# Patient Record
Sex: Female | Born: 1980 | Race: Black or African American | Hispanic: No | Marital: Single | Smoking: Current some day smoker
Health system: Southern US, Community
[De-identification: ages and names within clinical notes are randomized; demographics above are authoritative.]

## PROBLEM LIST (undated history)

## (undated) DIAGNOSIS — R519 Headache, unspecified: Secondary | ICD-10-CM

## (undated) DIAGNOSIS — R51 Headache: Secondary | ICD-10-CM

## (undated) DIAGNOSIS — M549 Dorsalgia, unspecified: Secondary | ICD-10-CM

## (undated) DIAGNOSIS — I10 Essential (primary) hypertension: Secondary | ICD-10-CM

## (undated) DIAGNOSIS — E7800 Pure hypercholesterolemia, unspecified: Secondary | ICD-10-CM

## (undated) DIAGNOSIS — G8929 Other chronic pain: Secondary | ICD-10-CM

## (undated) DIAGNOSIS — E785 Hyperlipidemia, unspecified: Secondary | ICD-10-CM

## (undated) DIAGNOSIS — E669 Obesity, unspecified: Secondary | ICD-10-CM

## (undated) HISTORY — DX: Dorsalgia, unspecified: M54.9

## (undated) HISTORY — DX: Pure hypercholesterolemia, unspecified: E78.00

## (undated) HISTORY — DX: Essential (primary) hypertension: I10

---
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
## 2011-10-09 DIAGNOSIS — R079 Chest pain, unspecified: Secondary | ICD-10-CM

## 2015-06-09 ENCOUNTER — Emergency Department (HOSPITAL_COMMUNITY)
Admission: EM | Admit: 2015-06-09 | Discharge: 2015-06-09 | Disposition: A | Discharge status: Home / Self Care | Payer: Medicaid Other | Attending: Emergency Medicine | Admitting: Emergency Medicine

## 2015-06-09 ENCOUNTER — Encounter (HOSPITAL_COMMUNITY): Payer: Self-pay

## 2015-06-09 DIAGNOSIS — I10 Essential (primary) hypertension: Secondary | ICD-10-CM | POA: Insufficient documentation

## 2015-06-09 DIAGNOSIS — H538 Other visual disturbances: Secondary | ICD-10-CM

## 2015-06-09 DIAGNOSIS — R51 Headache: Secondary | ICD-10-CM | POA: Insufficient documentation

## 2015-06-09 DIAGNOSIS — R519 Headache, unspecified: Secondary | ICD-10-CM

## 2015-06-09 DIAGNOSIS — F17200 Nicotine dependence, unspecified, uncomplicated: Secondary | ICD-10-CM | POA: Insufficient documentation

## 2015-06-09 DIAGNOSIS — Z791 Long term (current) use of non-steroidal anti-inflammatories (NSAID): Secondary | ICD-10-CM | POA: Insufficient documentation

## 2015-06-09 HISTORY — DX: Essential (primary) hypertension: I10

## 2015-06-09 LAB — PREGNANCY, URINE: Preg Test, Ur: NEGATIVE

## 2015-06-09 LAB — BASIC METABOLIC PANEL
Anion gap: 7 (ref 5–15)
BUN: 11 mg/dL (ref 6–20)
CO2: 25 mmol/L (ref 22–32)
Calcium: 8.9 mg/dL (ref 8.9–10.3)
Chloride: 108 mmol/L (ref 101–111)
Creatinine, Ser: 0.82 mg/dL (ref 0.44–1.00)
GFR calc Af Amer: >60 mL/min (ref >60)
GFR calc non Af Amer: >60 mL/min (ref >60)
Glucose, Bld: 93 mg/dL (ref 65–99)
Potassium: 3.6 mmol/L (ref 3.5–5.1)
Sodium: 140 mmol/L (ref 135–145)

## 2015-06-09 MED ORDER — METOCLOPRAMIDE HCL 10 MG PO TABS
10.0000 mg | ORAL_TABLET | Freq: Once | ORAL | Status: AC
  Administered 2015-06-09: 10 mg via ORAL
  Filled 2015-06-09: qty 1

## 2015-06-09 MED ORDER — ACETAMINOPHEN 500 MG PO TABS
1000.0000 mg | ORAL_TABLET | Freq: Once | ORAL | Status: AC
  Administered 2015-06-09: 1000 mg via ORAL
  Filled 2015-06-09: qty 2

## 2015-06-09 MED ORDER — SODIUM CHLORIDE 0.9 % IV BOLUS (SEPSIS)
500.0000 mL | Freq: Once | INTRAVENOUS | Status: AC
  Administered 2015-06-09: 500 mL via INTRAVENOUS

## 2015-06-09 MED ORDER — HYDROCHLOROTHIAZIDE 25 MG PO TABS
ORAL_TABLET | ORAL | Status: AC
  Filled 2015-06-09: qty 1

## 2015-06-09 MED ORDER — HYDROCHLOROTHIAZIDE 12.5 MG PO CAPS: 12.5000 mg | ORAL_CAPSULE | Freq: Every day | ORAL | Status: DC

## 2015-06-09 MED ORDER — HYDROCHLOROTHIAZIDE 12.5 MG PO CAPS
12.5000 mg | ORAL_CAPSULE | Freq: Every day | ORAL | Status: DC
  Administered 2015-06-09: 12.5 mg via ORAL
  Filled 2015-06-09 (×2): qty 1

## 2015-06-09 MED ORDER — DIPHENHYDRAMINE HCL 25 MG PO CAPS
25.0000 mg | ORAL_CAPSULE | Freq: Once | ORAL | Status: AC
  Administered 2015-06-09: 25 mg via ORAL
  Filled 2015-06-09: qty 1

## 2015-06-09 NOTE — ED Provider Notes (Signed)
CSN: [id number]     Arrival date & time 06/09/15  1412 History   First MD Initiated Contact with Patient 06/09/15 1435     Chief Complaint  Patient presents with  . Hypertension     (Consider location/radiation/quality/duration/timing/severity/associated sxs/prior Treatment) HPI Comments: Mia Norris is a 35 y.o. Female reports to ED with complaint of hypertension, headache, and visual changes. Patient reports being diagnosed with hypertension approximately one year ago. She was currently taking HCTZ for management; however, she ran out of the medication approximately three days ago. Patient reports approximately two days the onset of a headache and changes in vision, specifically, blurry vision. Headache onset was gradual in nature, reaching maximal intensity of 8/10 in two hours. Location is frontal and behind the eyes. Pulsating in nature. Associated lightheadedness and dizziness. Denies photophobia, nausea, vomiting, fever, neck pain, or numbness/tingling/weakness. Denies any trauma to head.    Patient is a 35 y.o. female presenting with hypertension. The history is provided by the patient.  Hypertension This is a chronic problem. The current episode started in the past 7 days. Associated symptoms include headaches and a visual change. Pertinent negatives include no abdominal pain, chest pain, chills, diaphoresis, fatigue, fever, nausea, neck pain, numbness, sore throat, vomiting or weakness.    Past Medical History  Diagnosis Date  . Hypertension    Past Surgical History  Procedure Laterality Date  . Cesarean section     No family history on file. Social History  Substance Use Topics  . Smoking status: Current Every Day Smoker  . Smokeless tobacco: None  . Alcohol Use: No   OB History    No data available     Review of Systems  Constitutional: Negative for fever, chills, diaphoresis and fatigue.  HENT: Negative for sore throat.   Eyes: Positive for visual disturbance.  Negative for photophobia.  Respiratory: Negative for shortness of breath.   Cardiovascular: Negative for chest pain and leg swelling.  Gastrointestinal: Negative for nausea, vomiting, abdominal pain, diarrhea, constipation and blood in stool.  Genitourinary: Negative for dysuria and hematuria.  Musculoskeletal: Negative for neck pain.  Neurological: Positive for dizziness, light-headedness and headaches. Negative for weakness and numbness.      Allergies  Penicillins  Home Medications   Prior to Admission medications   Medication Sig Start Date End Date Taking? Authorizing Provider  ibuprofen (ADVIL,MOTRIN) 200 MG tablet Take 400 mg by mouth every 6 (six) hours as needed for moderate pain.   Yes Historical Provider, MD  hydrochlorothiazide (MICROZIDE) 12.5 MG capsule Take 1 capsule (12.5 mg total) by mouth daily. 06/09/15   Lona Kettle, PA-C   BP 168/89 mmHg  Pulse 68  Temp(Src) 98 F (36.7 C) (Oral)  Resp 16  Ht  (1.676 m)  Wt 99.791 kg  BMI 35.53 kg/m2  SpO2 100%  LMP 05/06/2015 Physical Exam  Constitutional: She appears well-developed and well-nourished. No distress.  HENT:  Head: Normocephalic and atraumatic.  Mouth/Throat: Oropharynx is clear and moist. No oropharyngeal exudate.  2+ pulses in temporal arteries. No tenderness to palpation of temporal arteries.   Eyes: Conjunctivae, EOM and lids are normal. Pupils are equal, round, and reactive to light. Right eye exhibits no discharge. Left eye exhibits no discharge. No scleral icterus.  Visual acuity 20/30 in left and right eye.   Neck: Normal range of motion. Neck supple.  Cardiovascular: Normal rate, regular rhythm, normal heart sounds and intact distal pulses.   No murmur heard. Pulmonary/Chest: Effort normal and  breath sounds normal. No respiratory distress.  Abdominal: Soft. Bowel sounds are normal. There is no tenderness. There is no rebound and no guarding.  Musculoskeletal: Normal range of motion.   Lymphadenopathy:    She has no cervical adenopathy.  Neurological: She is alert. She has normal strength. No cranial nerve deficit or sensory deficit. She displays a negative Romberg sign. Coordination normal.  Skin: Skin is warm and dry. She is not diaphoretic.  Psychiatric: She has a normal mood and affect.    ED Course  Procedures (including critical care time) Labs Review Labs Reviewed  PREGNANCY, URINE  BASIC METABOLIC PANEL    Imaging Review No results found.    EKG Interpretation None      MDM   Final diagnoses:  Essential hypertension  Nonintractable headache, unspecified chronicity pattern, unspecified headache type  Blurry vision   Patient reports to ED with elevated blood pressure, headache, and blurry vision. DDx extensive to include stroke, intracranial hemorrhage, vertebral dissection, GCA, pre-eclampsia, PRES syndrome, or infectious etiology. History negative for fever, trauma, photophobia, sudden/severe onset of headache, numbness/tingling/weakness in extremities. No fevers. No focal neurologic deficits were noted on physical exam. Neck ROM and strength intact, no TTP of neck. Patient ambulates well. 2+ pulses and no TTP of temporal arteries. Negative pregnancy test. BMP within normal limits. Patient received fluids, home dose of HCTZ, and reglan/benadryl/acetaminophin for headache relief. On re-evaluation patient endorsed improvement in headache and subjective improvement in blurry vision. Blood pressure improved from 154/94 to 126/78. Headache and blurry vision most likely secondary to elevated blood pressure. Discussed with patient importance of maintaining adequate blood pressure control and that uncontrolled blood pressure increases risk of stroke. Prescribed patient's home hypertensive medication for 90 days. Encouraged to establish PCP and provided resources of local providers. Discussed return precautions to include worsening symptoms, fever, neck pain,  numbness/tingling/weakness in extremities. Patient voiced understanding and is agreeable.   Lona KettleAshley Laurel Meyer, PA-C 06/10/15 0053  Lona KettleAshley Laurel Meyer, PA-C 06/10/15 1053  Eber HongBrian Miller, MD 06/11/15 1005

## 2015-06-09 NOTE — ED Notes (Signed)
Pt reports ran out of her bp medication 3 days ago and c/o headache.  Pt takes hydrochlorothiazide 12.5mg  daily.

## 2015-06-09 NOTE — ED Notes (Signed)
In to discharge pt. bp still elevated. Will check again

## 2015-06-09 NOTE — Discharge Instructions (Signed)
Take blood pressure medications as prescribed. Maintain a low sodium diet. Make sure to establish PCP. If you develop worsening symptoms, fever, neck pain, shortness of breath, chest pain, numbness/tingling/weakness, dizziness, or loss of consciousness return to the ED immediately.  [employer] is a great source of information about community services available.  Access by dialing 2-1-1 from anywhere in [place], or by website -  PooledIncome.pl.   Other Local Resources (Updated 02/2015)  Financial Assistance   Services    Phone Number and Address  [facility]  Low-cost medical care - 1st and 3rd Saturday of every month  Must not qualify for public or private insurance and must have limited income [phone] [address] [place], [place]    [place] [employer] of Social Services  Child care  Emergency assistance for housing and [employer]  Medicaid [phone] [address] [place], [place] [address]   [facility]  Low-cost medical care for children, communicable diseases, sexually-transmitted diseases, immunizations, maternity care, womens health and family planning [phone] [address] [place], [place] [address]  [facility]   Medication assistance for [facility] residents  Must meet income requirements [phone] [address] [place], [place].    [facility] Social [facility]  Child care  Emergency assistance for housing and [employer]  Medicaid [phone] [place], [place] [address]  [facility]   Low-cost medical care,   Monday through Friday, 9 am to 6 pm.   Accepts Medicare/Medicaid, and self-pay [phone] [address] [place], [place] [address]  [facility]  Low-cost  medical care - Monday through Friday, 8:30 am - 5:30 pm  Accepts Medicaid and self-pay [phone] [address] [place], [place] [address]   [facility]  Primary medical care, including for those with sickle cell disease  Accepts Medicare, Medicaid, insurance and self-pay [phone] [address] [place], [place]  [facility]   Primary medical care  Accepts Medicare, [place], insurance and self-pay [phone] [address] [place], [place] [address]   [facility] Department of Social Services  Child care  Emergency assistance for housing and [employer]  Medicaid [phone] [address] [place], [place] [address]  [facility] Department of Health and [employer]  Child care  Emergency assistance for housing and [employer]  Medicaid [phone] [address] [place], [place] [address]   [facility] Medication Assistance Program  Medication assistance for [facility] residents with no insurance only  Must have a primary care doctor [phone] [address] [place], [place]  [facility]   Primary medical care  [place], [place], insurance  [phone] [address] [place], [place]  [facility]   Medication assistance [phone]  [facility]   Primary medical care  Accepts Medicare, [place], insurance and self-pay [phone] [address] [place], [place] [address]  [facility]   Primary medical care  Accepts Medicare, [place], insurance and self-pay [phone] [address] [place], [place] [address]  [facility]  For [place] residents between the ages of 75 and 66 who do not have any form of health insurance, Medicare, [place], or [place] benefits.  Services are provided free of charge to uninsured patients  who fall within  federal poverty guidelines.    Hours: Tuesdays and Thursdays, 4:15 - 8 pm [phone] [address] [place], [place] [address]  [facility]     Primary medical care  Dental care  Nutritional counseling  Pharmacy  Accepts Medicaid, Medicare, most insurance.  Fees are adjusted based on ability to pay.   [phone] [facility] [address] [place], [place]  [phone] [facility] [address] [place], [place]  [phone] [facility] [place], [place]  [phone] [facility], [address] [place], [place]  [phone] [facility] [address] [place], [place]  [facility]  Womens health and family planning [phone] [address] [place], [place]  [facility] Department of Social Services  Child care  Emergency assistance for housing and [employer]  Medicaid [phone] [address] [place], [place] [address]   [facility]    Ages 42 and older  Hours: Mondays and Thursdays, 7:00 am - 9:00 am Patients are seen on a first come, first served basis. [phone], [phone] [place], [place]  [facility] Division of Social Services  Child care  Emergency assistance for housing and [employer]  Medicaid [phone] [address] [place], [place] [address]  [facility]  Medication assistance  Rental assistance  Food pantry  Medication assistance  Housing assistance  Emergency food distribution  Utility assistance [phone] [address] [place], [place]  [phone]  [address] [place], [place] [address] Hours: Tuesdays and Thursdays from 9am - 12 noon by appointment only  [phone] [address] [place], [place] [address]  [facility]    Accepts private insurance, [place], and [place].  Payment is based on a sliding scale for those without insurance.  Hours: Mondays, Tuesdays and Thursdays, 8:30 am - 5:30 pm.   [phone] [address] [place], [place]  [facility] - Family Medicine at [facility], [place], and [place].  Payment is based on a sliding scale for those without insurance. [phone] [address] [place], [place]  [facility] - Pediatrics at [address]. Scientist, research (physical sciences), [employer], and [place].  Payment is based on a sliding scale for those without insurance [phone] [address], [employer], [place]  [facility] - Pediatrics at [employer], [place], and [place].  Payment is based on a sliding scale for those without insurance. [phone] [address] [place], [place]  [facility] - Pediatrics at [facility], [place], and [place].  Payment is based on a sliding scale for those without insurance. [phone], [phone] [place], [place].    [facility]  Maternity care.  Accepts Medicaid and self-pay. [phone] [address] [place], [place]    Managing Your High Blood Pressure Blood pressure is a measurement of how forceful your blood is pressing against the walls of the arteries. Arteries are muscular tubes within the circulatory system. Blood pressure does not stay the same. Blood pressure rises when you are active, excited, or nervous; and it lowers during sleep and relaxation. If the numbers measuring your blood pressure stay above normal most of the time, you are at risk for health problems.  High blood pressure (hypertension) is a long-term (chronic) condition in which blood pressure is elevated. A blood pressure reading is  recorded as two numbers, such as 120 over 80 (or 120/80). The first, higher number is called the systolic pressure. It is a measure of the pressure in your arteries as the heart beats. The second, lower number is called the diastolic pressure. It is a measure of the pressure in your arteries as the heart relaxes between beats.  Keeping your blood pressure in a normal range is important to your overall health and prevention of health problems, such as heart disease and stroke. When your blood pressure is uncontrolled, your heart has to work harder than normal. High blood pressure is a very common condition in adults because blood pressure tends to rise with age. Men and women are equally likely to have hypertension but at different times in life. Before age 35, men are more likely to have hypertension. After 35 years of age, women are more likely to have it. Hypertension is especially common in African Americans. This condition often has no signs or symptoms. The cause of the condition is usually not known. Your caregiver can help you come up with a plan to keep your blood pressure in a normal, healthy range. BLOOD PRESSURE STAGES Blood pressure is classified into four stages: normal, prehypertension, stage 1, and stage 2. Your blood pressure reading will be used to determine what type of treatment, if any, is necessary. Appropriate treatment options are tied to these four stages:  Normal  Systolic pressure (mm Hg): below 120.  Diastolic pressure (mm Hg): below 80. Prehypertension  Systolic pressure (mm Hg): 120 to 139.  Diastolic pressure (mm Hg): 80 to 89. Stage1  Systolic pressure (mm Hg): 140 to 159.  Diastolic pressure (mm Hg): 90 to 99. Stage2  Systolic pressure (mm Hg): 160 or above.  Diastolic pressure (mm Hg): 100 or above. RISKS RELATED TO HIGH BLOOD PRESSURE Managing your blood pressure is an important responsibility. Uncontrolled high blood pressure can lead to:  A heart  attack.  A stroke.  A weakened blood vessel (aneurysm).  Heart failure.  Kidney damage.  Eye damage.  Metabolic syndrome.  Memory and concentration problems. HOW TO MANAGE YOUR BLOOD PRESSURE Blood pressure can be managed effectively with lifestyle changes and medicines (if needed). Your caregiver will help you come up with a plan to bring your blood pressure within a normal range. Your plan should include the following: Education  Read all information provided by your caregivers about how to control blood pressure.  Educate yourself on the latest guidelines and treatment recommendations. New research is always being done to further define the risks and treatments for high blood pressure. Lifestylechanges  Control your weight.  Avoid smoking.  Stay physically active.  Reduce the amount of salt in your diet.  Reduce stress.  Control any chronic conditions, such as high cholesterol or diabetes.  Reduce your alcohol intake. Medicines  Several medicines (antihypertensive medicines) are available, if needed, to bring blood pressure within a normal range. Communication  Review all the medicines you take with your caregiver because there may be side effects or interactions.  Talk with your caregiver about your diet, exercise habits, and other lifestyle factors that may be contributing to high blood pressure.  See your caregiver regularly. Your caregiver can help you create and adjust your plan for managing high blood pressure. RECOMMENDATIONS FOR TREATMENT AND FOLLOW-UP  The following recommendations are based on current guidelines  for managing high blood pressure in nonpregnant adults. Use these recommendations to identify the proper follow-up period or treatment option based on your blood pressure reading. You can discuss these options with your caregiver.  Systolic pressure of 120 to 139 or diastolic pressure of 80 to 89: Follow up with your caregiver as  directed.  Systolic pressure of 140 to 160 or diastolic pressure of 90 to 100: Follow up with your caregiver within 2 months.  Systolic pressure above 160 or diastolic pressure above 100: Follow up with your caregiver within 1 month.  Systolic pressure above 180 or diastolic pressure above 110: Consider antihypertensive therapy; follow up with your caregiver within 1 week.  Systolic pressure above 200 or diastolic pressure above 120: Begin antihypertensive therapy; follow up with your caregiver within 1 week.   This information is not intended to replace advice given to you by your health care provider. Make sure you discuss any questions you have with your health care provider.   Document Released: 10/19/2011 Document Reviewed: 10/19/2011 Elsevier Interactive Patient Education [employer]

## 2017-08-23 ENCOUNTER — Encounter (HOSPITAL_COMMUNITY): Payer: Self-pay

## 2017-08-23 ENCOUNTER — Emergency Department (HOSPITAL_COMMUNITY): Payer: Self-pay

## 2017-08-23 ENCOUNTER — Other Ambulatory Visit: Payer: Self-pay

## 2017-08-23 ENCOUNTER — Emergency Department (HOSPITAL_COMMUNITY)
Admission: EM | Admit: 2017-08-23 | Discharge: 2017-08-23 | Disposition: A | Discharge status: Home / Self Care | Payer: Self-pay | Attending: Emergency Medicine | Admitting: Emergency Medicine

## 2017-08-23 DIAGNOSIS — Z79899 Other long term (current) drug therapy: Secondary | ICD-10-CM | POA: Insufficient documentation

## 2017-08-23 DIAGNOSIS — F17210 Nicotine dependence, cigarettes, uncomplicated: Secondary | ICD-10-CM | POA: Insufficient documentation

## 2017-08-23 DIAGNOSIS — R0789 Other chest pain: Secondary | ICD-10-CM | POA: Insufficient documentation

## 2017-08-23 DIAGNOSIS — I10 Essential (primary) hypertension: Secondary | ICD-10-CM | POA: Insufficient documentation

## 2017-08-23 DIAGNOSIS — Z760 Encounter for issue of repeat prescription: Secondary | ICD-10-CM | POA: Insufficient documentation

## 2017-08-23 HISTORY — DX: Hyperlipidemia, unspecified: E78.5

## 2017-08-23 HISTORY — DX: Obesity, unspecified: E66.9

## 2017-08-23 HISTORY — DX: Headache: R51

## 2017-08-23 HISTORY — DX: Other chronic pain: G89.29

## 2017-08-23 HISTORY — DX: Headache, unspecified: R51.9

## 2017-08-23 LAB — URINALYSIS, ROUTINE W REFLEX MICROSCOPIC
BILIRUBIN URINE: NEGATIVE
GLUCOSE, UA: NEGATIVE mg/dL
HGB URINE DIPSTICK: NEGATIVE
Ketones, ur: NEGATIVE mg/dL
Leukocytes, UA: NEGATIVE
Nitrite: NEGATIVE
PH: 5 (ref 5.0–8.0)
PROTEIN: NEGATIVE mg/dL
Specific Gravity, Urine: 1.018 (ref 1.005–1.030)

## 2017-08-23 LAB — CBC
HEMATOCRIT: 41.5 % (ref 36.0–46.0)
Hemoglobin: 13.3 g/dL (ref 12.0–15.0)
MCH: 27.7 pg (ref 26.0–34.0)
MCHC: 32 g/dL (ref 30.0–36.0)
MCV: 86.5 fL (ref 78.0–100.0)
Platelets: 299 10*3/uL (ref 150–400)
RBC: 4.8 MIL/uL (ref 3.87–5.11)
RDW: 13.8 % (ref 11.5–15.5)
WBC: 9.5 10*3/uL (ref 4.0–10.5)

## 2017-08-23 LAB — BASIC METABOLIC PANEL
ANION GAP: 9 (ref 5–15)
BUN: 8 mg/dL (ref 6–20)
CALCIUM: 8.9 mg/dL (ref 8.9–10.3)
CO2: 23 mmol/L (ref 22–32)
Chloride: 106 mmol/L (ref 98–111)
Creatinine, Ser: 0.81 mg/dL (ref 0.44–1.00)
GFR calc Af Amer: >60 mL/min (ref >60)
GFR calc non Af Amer: >60 mL/min (ref >60)
GLUCOSE: 86 mg/dL (ref 70–99)
POTASSIUM: 3.5 mmol/L (ref 3.5–5.1)
SODIUM: 138 mmol/L (ref 135–145)

## 2017-08-23 LAB — PREGNANCY, URINE: PREG TEST UR: NEGATIVE

## 2017-08-23 LAB — TROPONIN I

## 2017-08-23 LAB — D-DIMER, QUANTITATIVE (NOT AT ARMC): D DIMER QUANT: 0.45 ug{FEU}/mL (ref 0.00–0.50)

## 2017-08-23 MED ORDER — HYDROCHLOROTHIAZIDE 12.5 MG PO TABS: 12.5000 mg | ORAL_TABLET | Freq: Every day | ORAL | 0 refills | Status: DC

## 2017-08-23 MED ORDER — HYDROCODONE-ACETAMINOPHEN 5-325 MG PO TABS: ORAL_TABLET | ORAL | 0 refills | Status: DC

## 2017-08-23 MED ORDER — METHOCARBAMOL 500 MG PO TABS: 1000.0000 mg | ORAL_TABLET | Freq: Four times a day (QID) | ORAL | 0 refills | Status: DC | PRN

## 2017-08-23 MED ORDER — OXYCODONE-ACETAMINOPHEN 5-325 MG PO TABS
1.0000 | ORAL_TABLET | Freq: Once | ORAL | Status: AC
  Administered 2017-08-23: 1 via ORAL
  Filled 2017-08-23: qty 1

## 2017-08-23 NOTE — ED Notes (Signed)
Pain is in mid chest, worse with palpation. Also has scapular pain towards spine.

## 2017-08-23 NOTE — ED Provider Notes (Signed)
[facility] EMERGENCY DEPARTMENT Provider Note   CSN: [id number] Arrival date & time: 08/23/17  1256     History   Chief Complaint Chief Complaint  Patient presents with  . Chest Pain    HPI Mia Norris is a 37 y.o. female.  HPI Pt was seen at 1325. Per pt, c/o gradual onset and persistence of constant left upper chest wall "pain" since 08/18/2017. Pt describes the pain as constant, "aching," worsening with palpation of the area and moving her left shoulder/arm. Pt states she was evaluated at [facility] for this complaint on 08/21/2017, dx chest wall pain, rx naprosyn which she did not fill. Pt states she was told to f/u with her PMD regarding her chronic HTN and refill of her BP meds. Pt was evaluated by the Health Department today, informed them of her chest pain, and was sent to the ED for further evaluation. Pt denies any change in her constant symptoms over the past 5 days. Denies new symptoms. Denies palpitations, no SOB/cough, no abd pain, no N/V/D, no injury, no fevers, no focal motor weakness, no tingling/numbness in extremities.    Past Medical History:  Diagnosis Date  . Chronic headaches   . Hyperlipidemia   . Hypertension   . Obesity     There are no active problems to display for this patient.   Past Surgical History:  Procedure Laterality Date  . CESAREAN SECTION       OB History   None      Home Medications    Prior to Admission medications   Medication Sig Start Date End Date Taking? Authorizing Provider  Aspirin-Acetaminophen-Caffeine (EXCEDRIN MIGRAINE PO) Take 2 tablets by mouth daily as needed (headaches).   Yes [provider]  diltiazem (TIAZAC) 180 MG 24 hr capsule Take 180 mg by mouth daily. 06/12/17  Yes [provider]  ibuprofen (ADVIL,MOTRIN) 200 MG tablet Take 400 mg by mouth every 6 (six) hours as needed for moderate pain.   Yes [provider]  Multiple Vitamins-Iron (DAILY MULTIPLE VITAMIN/IRON) TABS  Take 1 tablet by mouth daily.   Yes [provider]  Multiple Vitamins-Minerals (ONE-A-DAY WOMENS VITACRAVES) CHEW Chew 1 each by mouth daily.   Yes [provider]  hydrochlorothiazide (MICROZIDE) 12.5 MG capsule Take 1 capsule (12.5 mg total) by mouth daily. Patient not taking: Reported on 08/23/2017 06/09/15   Deborha Payment, PA-C    Family History No family history on file.  Social History Social History   Tobacco Use  . Smoking status: Current Every Day Smoker    Packs/day: 0.50    Types: Cigarettes  Substance Use Topics  . Alcohol use: No  . Drug use: No     Allergies   Penicillins   Review of Systems Review of Systems ROS: Statement: All systems negative except as marked or noted in the HPI; Constitutional: Negative for fever and chills. ; ; Eyes: Negative for eye pain, redness and discharge. ; ; ENMT: Negative for ear pain, hoarseness, nasal congestion, sinus pressure and sore throat. ; ; Cardiovascular: Negative for palpitations, diaphoresis, dyspnea and peripheral edema. ; ; Respiratory: Negative for cough, wheezing and stridor. ; ; Gastrointestinal: Negative for nausea, vomiting, diarrhea, abdominal pain, blood in stool, hematemesis, jaundice and rectal bleeding. . ; ; Genitourinary: Negative for dysuria, flank pain and hematuria. ; ; Musculoskeletal: +chest wall pain. Negative for back pain and neck pain. Negative for swelling and trauma.; ; Skin: Negative for pruritus, rash, abrasions, blisters, bruising  and skin lesion.; ; Neuro: Negative for headache, lightheadedness and neck stiffness. Negative for weakness, altered level of consciousness, altered mental status, extremity weakness, paresthesias, involuntary movement, seizure and syncope.       Physical Exam Updated Vital Signs BP (!) 160/87   Pulse (!) 49   Temp 98.1 F (36.7 C) (Oral)   Resp 14   Ht 5\' 6"  (1.676 m)   Wt 116.1 kg (256 lb)   LMP 08/14/2017   SpO2 100%   BMI 41.32 kg/m     Patient Vitals for the past 24 hrs:  BP Temp Temp src Pulse Resp SpO2 Height Weight  08/23/17 1500 (!) 150/99 - - (!) 48 14 100 % - -  08/23/17 1430 (!) 160/87 - - (!) 49 14 100 % - -  08/23/17 1400 (!) 160/82 - - 63 14 100 % - -  08/23/17 1330 (!) 151/111 - - (!) 56 13 100 % - -  08/23/17 1313 - 98.1 F (36.7 C) Oral - - - - -  08/23/17 1313 (!) 160/104 - - (!) 54 20 99 % - -  08/23/17 1310 - - - - - - 5\' 6"  (1.676 m) 116.1 kg (256 lb)  08/23/17 1309 (!) 192/112 - - 67 20 98 % - -     Physical Exam 1330: Physical examination:  Nursing notes reviewed; Vital signs and O2 SAT reviewed;  Constitutional: Well developed, Well nourished, Well hydrated, In no acute distress; Head:  Normocephalic, atraumatic; Eyes: EOMI, PERRL, No scleral icterus; ENMT: Mouth and pharynx normal, Mucous membranes moist; Neck: Supple, Full range of motion, No lymphadenopathy; Cardiovascular: Regular rate and rhythm, No gallop; Respiratory: Breath sounds clear & equal bilaterally, No wheezes.  Speaking full sentences with ease, Normal respiratory effort/excursion; Chest: +left upper chest wall tenderness to palp. No soft tissue crepitus, no deformity. Movement normal; Abdomen: Soft, Nontender, Nondistended, Normal bowel sounds; Genitourinary: No CVA tenderness; Spine:  No midline CS, TS, LS tenderness. +mild TTP left trapezius muscle. No rash.; Extremities: Peripheral pulses normal, No tenderness, No edema, No calf edema or asymmetry.; Neuro: AA&Ox3, Major CN grossly intact.  Speech clear. No gross focal motor or sensory deficits in extremities.; Skin: Color normal, Warm, Dry.    ED Treatments / Results  Labs (all labs ordered are listed, but only abnormal results are displayed)   EKG EKG Interpretation  Date/Time:  Wednesday August 23 2017 13:11:40 EDT Ventricular Rate:  60 PR Interval:    QRS Duration: 100 QT Interval:  429 QTC Calculation: 429 R Axis:   -31 Text Interpretation:  Sinus rhythm Left axis  deviation Borderline T abnormalities, inferior leads No old tracing to compare Confirmed by Samuel Jester [phone]) on 08/23/2017 2:05:22 PM   Radiology   Procedures Procedures (including critical care time)  Medications Ordered in ED Medications  oxyCODONE-acetaminophen (PERCOCET/ROXICET) 5-325 MG per tablet 1 tablet (1 tablet Oral Given 08/23/17 1354)     Initial Impression / Assessment and Plan / ED Course  I have reviewed the triage vital signs and the nursing notes.  Pertinent labs & imaging results that were available during my care of the patient were reviewed by me and considered in my medical decision making (see chart for details).  MDM Reviewed: previous chart, nursing note and vitals Interpretation: labs, ECG and x-ray   Results for orders placed or performed during the hospital encounter of 08/23/17  Basic metabolic panel  Result Value Ref Range   Sodium 138 135 - 145 mmol/L  Potassium 3.5 3.5 - 5.1 mmol/L   Chloride 106 98 - 111 mmol/L   CO2 23 22 - 32 mmol/L   Glucose, Bld 86 70 - 99 mg/dL   BUN 8 6 - 20 mg/dL   Creatinine, Ser 2.950.81 0.44 - 1.00 mg/dL   Calcium 8.9 8.9 - 28.410.3 mg/dL   GFR calc non Af Amer >60 >60 mL/min   GFR calc Af Amer >60 >60 mL/min   Anion gap 9 5 - 15  CBC  Result Value Ref Range   WBC 9.5 4.0 - 10.5 K/uL   RBC 4.80 3.87 - 5.11 MIL/uL   Hemoglobin 13.3 12.0 - 15.0 g/dL   HCT 13.241.5 44.036.0 - 10.246.0 %   MCV 86.5 78.0 - 100.0 fL   MCH 27.7 26.0 - 34.0 pg   MCHC 32.0 30.0 - 36.0 g/dL   RDW 72.513.8 36.611.5 - 44.015.5 %   Platelets 299 150 - 400 K/uL  Troponin I  Result Value Ref Range   Troponin I <0.03 <0.03 ng/mL  D-dimer, quantitative  Result Value Ref Range   D-Dimer, Quant 0.45 0.00 - 0.50 ug/mL-FEU  Pregnancy, urine  Result Value Ref Range   Preg Test, Ur NEGATIVE NEGATIVE  Urinalysis, Routine w reflex microscopic  Result Value Ref Range   Color, Urine YELLOW YELLOW   APPearance CLEAR CLEAR   Specific Gravity, Urine 1.018 1.005 -  1.030   pH 5.0 5.0 - 8.0   Glucose, UA NEGATIVE NEGATIVE mg/dL   Hgb urine dipstick NEGATIVE NEGATIVE   Bilirubin Urine NEGATIVE NEGATIVE   Ketones, ur NEGATIVE NEGATIVE mg/dL   Protein, ur NEGATIVE NEGATIVE mg/dL   Nitrite NEGATIVE NEGATIVE   Leukocytes, UA NEGATIVE NEGATIVE   Dg Chest 2 View Result Date: 08/23/2017 CLINICAL DATA:  Left sided chest pain with breathing that radiates into back of left arm x 4 days. Hx of HTN. EXAM: CHEST - 2 VIEW COMPARISON:  none FINDINGS: Lungs are clear. Heart size and mediastinal contours are within normal limits. No effusion.  No pneumothorax. Visualized bones unremarkable. IMPRESSION: No acute cardiopulmonary disease. Electronically Signed   By: Corlis Leak  Hassell M.D.   On: 08/23/2017 14:06    1510:  Pt previously taking HCTZ; will refill. Pt feels better after pain meds and wants to go home now. Doubt PE as cause for symptoms with normal d-dimer and low risk Wells.  Doubt ACS as cause for symptoms with normal troponin and EKG without acute STTW changes after 5 days of constant symptoms.Tx symptomatically at this time.  Dx and testing d/w pt and family.  Questions answered.  Verb understanding, agreeable to d/c home with outpt f/u.     Final Clinical Impressions(s) / ED Diagnoses   Final diagnoses:  None    ED Discharge Orders    None       Samuel JesterMcManus, Diondra Pines, DO 08/27/17 1722

## 2017-08-23 NOTE — Discharge Instructions (Addendum)
Take the prescriptions as directed.  Apply moist heat or ice to the area(s) of discomfort, for 15 minutes at a time, several times per day for the next few days.  Do not fall asleep on a heating or ice pack. Take your blood pressure only once per day, once or twice per week, either in the morning approximately 1 hour after you take your medicine(s) or in the evening before you go to bed.  Always sit quietly for at least 15 minutes before taking your blood pressure.  Keep a diary of your blood pressures to show your doctor at your follow up office visit.  Call your regular medical doctor today to schedule a follow up appointment within the next 3 days.  Return to the Emergency Department immediately sooner if worsening.

## 2017-08-23 NOTE — ED Triage Notes (Signed)
Pt reports CP when breathing and lifting left arm up since 08/19/17. [facility] and given prescription for Naprosyn which she states she did not get filled because she didn't have money. Pt sent from health dept due to chest pain. BP elevated and dizzy and has not had BP med for 3-4 weeks

## 2019-01-23 ENCOUNTER — Emergency Department (HOSPITAL_COMMUNITY)
Admission: EM | Admit: 2019-01-23 | Discharge: 2019-01-23 | Disposition: A | Discharge status: Home / Self Care | Payer: Medicaid Other | Attending: Emergency Medicine | Admitting: Emergency Medicine

## 2019-01-23 ENCOUNTER — Encounter (HOSPITAL_COMMUNITY): Payer: Self-pay | Admitting: Emergency Medicine

## 2019-01-23 ENCOUNTER — Other Ambulatory Visit: Payer: Self-pay

## 2019-01-23 DIAGNOSIS — Z79899 Other long term (current) drug therapy: Secondary | ICD-10-CM | POA: Insufficient documentation

## 2019-01-23 DIAGNOSIS — N939 Abnormal uterine and vaginal bleeding, unspecified: Secondary | ICD-10-CM | POA: Insufficient documentation

## 2019-01-23 DIAGNOSIS — I10 Essential (primary) hypertension: Secondary | ICD-10-CM | POA: Insufficient documentation

## 2019-01-23 DIAGNOSIS — F17210 Nicotine dependence, cigarettes, uncomplicated: Secondary | ICD-10-CM | POA: Insufficient documentation

## 2019-01-23 LAB — CBC WITH DIFFERENTIAL/PLATELET
Abs Immature Granulocytes: 0.02 10*3/uL (ref 0.00–0.07)
Basophils Absolute: 0.1 10*3/uL (ref 0.0–0.1)
Basophils Relative: 1 %
Eosinophils Absolute: 0.2 10*3/uL (ref 0.0–0.5)
Eosinophils Relative: 3 %
HCT: 29.7 % — ABNORMAL LOW (ref 36.0–46.0)
Hemoglobin: 9.3 g/dL — ABNORMAL LOW (ref 12.0–15.0)
Immature Granulocytes: 0 %
Lymphocytes Relative: 28 %
Lymphs Abs: 2.2 10*3/uL (ref 0.7–4.0)
MCH: 27.6 pg (ref 26.0–34.0)
MCHC: 31.3 g/dL (ref 30.0–36.0)
MCV: 88.1 fL (ref 80.0–100.0)
Monocytes Absolute: 0.6 10*3/uL (ref 0.1–1.0)
Monocytes Relative: 7 %
Neutro Abs: 4.8 10*3/uL (ref 1.7–7.7)
Neutrophils Relative %: 61 %
Platelets: 268 10*3/uL (ref 150–400)
RBC: 3.37 MIL/uL — ABNORMAL LOW (ref 3.87–5.11)
RDW: 16.8 % — ABNORMAL HIGH (ref 11.5–15.5)
WBC: 7.9 10*3/uL (ref 4.0–10.5)
nRBC: 0 % (ref 0.0–0.2)

## 2019-01-23 LAB — PROTIME-INR
INR: 1 (ref 0.8–1.2)
Prothrombin Time: 13.5 seconds (ref 11.4–15.2)

## 2019-01-23 LAB — COMPREHENSIVE METABOLIC PANEL
ALT: 33 U/L (ref 0–44)
AST: 28 U/L (ref 15–41)
Albumin: 3.6 g/dL (ref 3.5–5.0)
Alkaline Phosphatase: 43 U/L (ref 38–126)
Anion gap: 9 (ref 5–15)
BUN: 14 mg/dL (ref 6–20)
CO2: 27 mmol/L (ref 22–32)
Calcium: 8.9 mg/dL (ref 8.9–10.3)
Chloride: 102 mmol/L (ref 98–111)
Creatinine, Ser: 0.7 mg/dL (ref 0.44–1.00)
GFR calc Af Amer: >60 mL/min (ref >60)
GFR calc non Af Amer: >60 mL/min (ref >60)
Glucose, Bld: 113 mg/dL — ABNORMAL HIGH (ref 70–99)
Potassium: 3.4 mmol/L — ABNORMAL LOW (ref 3.5–5.1)
Sodium: 138 mmol/L (ref 135–145)
Total Bilirubin: 0.7 mg/dL (ref 0.3–1.2)
Total Protein: 6.5 g/dL (ref 6.5–8.1)

## 2019-01-23 LAB — TYPE AND SCREEN
ABO/RH(D): B POS
Antibody Screen: NEGATIVE

## 2019-01-23 LAB — HCG, QUANTITATIVE, PREGNANCY: hCG, Beta Chain, Quant, S: <1 m[IU]/mL (ref <5)

## 2019-01-23 MED ORDER — FERROUS SULFATE 325 (65 FE) MG PO TABS: 325.0000 mg | ORAL_TABLET | Freq: Every day | ORAL | 0 refills | Status: DC

## 2019-01-23 MED ORDER — MEGESTROL ACETATE 40 MG PO TABS: 40.0000 mg | ORAL_TABLET | Freq: Two times a day (BID) | ORAL | 0 refills | Status: AC

## 2019-01-23 NOTE — ED Provider Notes (Addendum)
[facility] Provider Note   CSN: [id number] Arrival date & time: 01/23/19  0710     History Chief Complaint  Patient presents with   Vaginal Bleeding    Mia AlandKristie Norris is a 38 y.o. female.  HPI    This patient is a pleasant 38 year old female with a history of hyperlipidemia and hypertension, she also has a history of recently having some increased vaginal bleeding which has been going on for about 2 weeks.  She is concerned because the appearance of the blood seems to be in clots, and is associated with some abdominal cramping and some generalized fatigue.  She denies prior blood transfusions, denies prior history of coagulopathy.  She was seen at an outside emergency department 48 hours ago, states that she had some blood work done and was told to follow-up with her primary doctor.  She knows that she has a history of anemia.  She endorses having sexual activity, she does not use any protection, she does not think she is pregnant.  She has not taken a home pregnancy test and is unaware if there was tested for pregnancy at the outside hospital.  She has not had any medications for the bleeding and has not seen a gynecologist, she does not have an established provider, she goes to the health department for her care.  Past Medical History:  Diagnosis Date   Chronic headaches    Hyperlipidemia    Hypertension    Obesity     There are no problems to display for this patient.   Past Surgical History:  Procedure Laterality Date   CESAREAN SECTION       OB History   No obstetric history on file.     History reviewed. No pertinent family history.  Social History   Tobacco Use   Smoking status: Current Every Day Smoker    Packs/day: 0.50    Types: Cigarettes   Smokeless tobacco: Never Used  Substance Use Topics   Alcohol use: No   Drug use: No    Home Medications Prior to Admission medications   Medication Sig Start Date End Date  Taking? Authorizing Provider  Aspirin-Acetaminophen-Caffeine (EXCEDRIN MIGRAINE PO) Take 2 tablets by mouth daily as needed (headaches).    [provider]  diltiazem (TIAZAC) 180 MG 24 hr capsule Take 180 mg by mouth daily. 06/12/17   [provider]  ferrous sulfate 325 (65 FE) MG tablet Take 1 tablet (325 mg total) by mouth daily. 01/23/19   Eber HongMiller, Ekin Pilar, MD  hydrochlorothiazide (HYDRODIURIL) 12.5 MG tablet Take 1 tablet (12.5 mg total) by mouth daily. 08/23/17   Samuel JesterMcManus, Kathleen, DO  hydrochlorothiazide (MICROZIDE) 12.5 MG capsule Take 1 capsule (12.5 mg total) by mouth daily. Patient not taking: Reported on 08/23/2017 06/09/15   Deborha PaymentMeyer, Ashley L, PA-C  HYDROcodone-acetaminophen (NORCO/VICODIN) 5-325 MG tablet 1 or 2 tabs PO q8 hours prn pain 08/23/17   Samuel JesterMcManus, Kathleen, DO  ibuprofen (ADVIL,MOTRIN) 200 MG tablet Take 400 mg by mouth every 6 (six) hours as needed for moderate pain.    [provider]  megestrol (MEGACE) 40 MG tablet Take 1 tablet (40 mg total) by mouth 2 (two) times daily for 5 days. Take twice daily until bleeding stops - consult with your Gynecologist before starting this medicine 01/23/19 01/28/19  Eber HongMiller, Laban Orourke, MD  methocarbamol (ROBAXIN) 500 MG tablet Take 2 tablets (1,000 mg total) by mouth 4 (four) times daily as needed for muscle spasms (muscle spasm/pain). 08/23/17  Francine Graven, DO  Multiple Vitamins-Iron (DAILY MULTIPLE VITAMIN/IRON) TABS Take 1 tablet by mouth daily.    [provider]  Multiple Vitamins-Minerals (ONE-A-DAY WOMENS VITACRAVES) CHEW Chew 1 each by mouth daily.    [provider]    Allergies    Penicillins  Review of Systems   Review of Systems  All other systems reviewed and are negative.   Physical Exam Updated Vital Signs BP 130/85 (BP Location: Left Arm)    Pulse 69    Temp 98.1 F (36.7 C) (Oral)    Resp 18    Ht 1.676 m (5\' 6" )    Wt 115.2 kg    LMP 01/09/2019    SpO2 96%    BMI 41.00 kg/m     Physical Exam Vitals and nursing note reviewed.  Constitutional:      General: She is not in acute distress.    Appearance: She is well-developed.  HENT:     Head: Normocephalic and atraumatic.     Mouth/Throat:     Pharynx: No oropharyngeal exudate.  Eyes:     General: No scleral icterus.       Right eye: No discharge.        Left eye: No discharge.     Conjunctiva/sclera: Conjunctivae normal.     Pupils: Pupils are equal, round, and reactive to light.  Neck:     Thyroid: No thyromegaly.     Vascular: No JVD.  Cardiovascular:     Rate and Rhythm: Normal rate and regular rhythm.     Heart sounds: Normal heart sounds. No murmur. No friction rub. No gallop.   Pulmonary:     Effort: Pulmonary effort is normal. No respiratory distress.     Breath sounds: Normal breath sounds. No wheezing or rales.  Abdominal:     General: Bowel sounds are normal. There is no distension.     Palpations: Abdomen is soft. There is no mass.     Tenderness: There is abdominal tenderness.     Comments: Mild reproducible lower abdominal tenderness to palpation, no guarding or peritoneal signs, no specific pain McBurney's point and no Murphy sign  Musculoskeletal:        General: No tenderness. Normal range of motion.     Cervical back: Normal range of motion and neck supple.  Lymphadenopathy:     Cervical: No cervical adenopathy.  Skin:    General: Skin is warm and dry.     Findings: No erythema or rash.  Neurological:     General: No focal deficit present.     Mental Status: She is alert. Mental status is at baseline.     Coordination: Coordination normal.     Comments: Able to walk and talk at her baseline, normal coordination and speech, no facial droop  Psychiatric:        Behavior: Behavior normal.     ED Results / Procedures / Treatments   Labs (all labs ordered are listed, but only abnormal results are displayed) Labs Reviewed  CBC WITH DIFFERENTIAL/PLATELET - Abnormal; Notable for  the following components:      Result Value   RBC 3.37 (*)    Hemoglobin 9.3 (*)    HCT 29.7 (*)    RDW 16.8 (*)    All other components within normal limits  COMPREHENSIVE METABOLIC PANEL - Abnormal; Notable for the following components:   Potassium 3.4 (*)    Glucose, Bld 113 (*)    All other components within  normal limits  PROTIME-INR  HCG, QUANTITATIVE, PREGNANCY  TYPE AND SCREEN    EKG None  Radiology No results found.  Procedures Procedures (including critical care time)  Medications Ordered in ED Medications - No data to display  ED Course  I have reviewed the triage vital signs and the nursing notes.  Pertinent labs & imaging results that were available during my care of the patient were reviewed by me and considered in my medical decision making (see chart for details).    MDM Rules/Calculators/A&P                      The patient's exam is rather unremarkable, her vital signs are normal without hypotension tachycardia fever or any other abnormal vital signs.  I suspect that if she is not pregnant this is likely related to an anovulatory cycle, will rule out anemia, she may need some Megace and follow-up with gynecology, the patient is agreeable, the health department has referred her to a gynecologic clinic, the appointment is forthcoming  CBC shows anemia to 9.3 hemoglobin, not pregnant, normal metabolic panel.  The patient will need to have outpatient treatment with Megace, she is otherwise stable appearing with no tachycardia or hypotension.  She is agreeable to the plan  Final Clinical Impression(s) / ED Diagnoses Final diagnoses:  Abnormal uterine bleeding (AUB)    Rx / DC Orders ED Discharge Orders         Ordered    megestrol (MEGACE) 40 MG tablet  2 times daily     01/23/19 0938    ferrous sulfate 325 (65 FE) MG tablet  Daily     01/23/19 0939           Eber Hong, MD 01/23/19 6045    Eber Hong, MD 01/23/19 [phone]

## 2019-01-23 NOTE — Discharge Instructions (Signed)
Please start taking daily iron supplements, you can get one from your local pharmacy.  I would also recommend that you start taking Megace, take 1 tablet twice a day for the next 5 days which should help with your bleeding as well.  If you do not have a gynecologist please see the phone number above for Dr. Glo Herring, he has our local gynecologist and can follow-up with you in follow-up.  Seek medical exam for severe or worsening difficulty breathing, weakness, fatigue or severe or worsening bleeding.

## 2019-01-23 NOTE — ED Triage Notes (Signed)
PT states she has had heavy vaginal bleeding x2 weeks with clots. PT states she was seen at [facility] on 01/21/2019 in the ED and had blood work done. PT states she feels weak and has lower abdominal pressure.

## 2019-05-01 ENCOUNTER — Ambulatory Visit (HOSPITAL_COMMUNITY)
Admission: RE | Admit: 2019-05-01 | Discharge: 2019-05-01 | Disposition: A | Discharge status: Home / Self Care | Payer: Self-pay | Source: Ambulatory Visit | Attending: *Deleted | Admitting: *Deleted

## 2019-05-01 ENCOUNTER — Other Ambulatory Visit (HOSPITAL_COMMUNITY): Payer: Self-pay | Admitting: *Deleted

## 2019-05-01 ENCOUNTER — Other Ambulatory Visit: Payer: Self-pay

## 2019-05-01 DIAGNOSIS — R0781 Pleurodynia: Secondary | ICD-10-CM

## 2019-05-07 ENCOUNTER — Other Ambulatory Visit: Payer: Self-pay | Admitting: *Deleted

## 2019-05-07 ENCOUNTER — Other Ambulatory Visit (HOSPITAL_COMMUNITY): Payer: Self-pay | Admitting: *Deleted

## 2019-05-07 DIAGNOSIS — R0781 Pleurodynia: Secondary | ICD-10-CM

## 2019-05-13 ENCOUNTER — Ambulatory Visit (HOSPITAL_COMMUNITY): Payer: Self-pay

## 2019-05-29 ENCOUNTER — Ambulatory Visit (HOSPITAL_COMMUNITY)
Admission: RE | Admit: 2019-05-29 | Discharge: 2019-05-29 | Disposition: A | Discharge status: Home / Self Care | Payer: Self-pay | Source: Ambulatory Visit | Attending: *Deleted | Admitting: *Deleted

## 2019-05-29 ENCOUNTER — Other Ambulatory Visit: Payer: Self-pay

## 2019-05-29 DIAGNOSIS — R0781 Pleurodynia: Secondary | ICD-10-CM | POA: Insufficient documentation

## 2019-09-08 ENCOUNTER — Emergency Department (HOSPITAL_COMMUNITY)
Admission: EM | Admit: 2019-09-08 | Discharge: 2019-09-09 | Disposition: A | Discharge status: Home / Self Care | Payer: Self-pay | Attending: Emergency Medicine | Admitting: Emergency Medicine

## 2019-09-08 ENCOUNTER — Other Ambulatory Visit: Payer: Self-pay

## 2019-09-08 ENCOUNTER — Encounter (HOSPITAL_COMMUNITY): Payer: Self-pay | Admitting: Emergency Medicine

## 2019-09-08 ENCOUNTER — Emergency Department (HOSPITAL_COMMUNITY): Payer: Self-pay

## 2019-09-08 DIAGNOSIS — R5383 Other fatigue: Secondary | ICD-10-CM | POA: Insufficient documentation

## 2019-09-08 DIAGNOSIS — R079 Chest pain, unspecified: Secondary | ICD-10-CM | POA: Insufficient documentation

## 2019-09-08 DIAGNOSIS — F17210 Nicotine dependence, cigarettes, uncomplicated: Secondary | ICD-10-CM | POA: Insufficient documentation

## 2019-09-08 DIAGNOSIS — Z79899 Other long term (current) drug therapy: Secondary | ICD-10-CM | POA: Insufficient documentation

## 2019-09-08 DIAGNOSIS — E876 Hypokalemia: Secondary | ICD-10-CM | POA: Insufficient documentation

## 2019-09-08 DIAGNOSIS — I10 Essential (primary) hypertension: Secondary | ICD-10-CM | POA: Insufficient documentation

## 2019-09-08 LAB — CBC
HCT: 41.7 % (ref 36.0–46.0)
Hemoglobin: 13.3 g/dL (ref 12.0–15.0)
MCH: 28.5 pg (ref 26.0–34.0)
MCHC: 31.9 g/dL (ref 30.0–36.0)
MCV: 89.5 fL (ref 80.0–100.0)
Platelets: 257 10*3/uL (ref 150–400)
RBC: 4.66 MIL/uL (ref 3.87–5.11)
RDW: 14.1 % (ref 11.5–15.5)
WBC: 13.8 10*3/uL — ABNORMAL HIGH (ref 4.0–10.5)
nRBC: 0 % (ref 0.0–0.2)

## 2019-09-08 MED ORDER — NITROGLYCERIN 0.4 MG SL SUBL: 0.4000 mg | SUBLINGUAL_TABLET | SUBLINGUAL | Status: DC | PRN

## 2019-09-08 MED ORDER — ASPIRIN 81 MG PO CHEW
324.0000 mg | CHEWABLE_TABLET | Freq: Once | ORAL | Status: AC
  Administered 2019-09-08: 324 mg via ORAL
  Filled 2019-09-08: qty 4

## 2019-09-08 NOTE — ED Triage Notes (Signed)
Pt c/o non radiating chest tightness that began yesterday. Denies N/V/D, cough, covid exposure.

## 2019-09-08 NOTE — ED Notes (Signed)
Call to lab for draw

## 2019-09-08 NOTE — ED Provider Notes (Signed)
[facility] EMERGENCY DEPARTMENT Provider Note   CSN: [id number] Arrival date & time: 09/08/19  2118   History Chief Complaint  Patient presents with  . Chest Pain    Mia Norris is a 39 y.o. female.  The history is provided by the patient.  Chest Pain She has history of hypertension, hyperlipidemia and comes in because of fatigue and chest pain since yesterday.  She describes a tight feeling in her chest which has been constant.  Nothing makes it better, nothing makes it worse.  She rates it at 5/10.  She denies dyspnea, nausea, diaphoresis.  She denies fever or chills.  She denies cough.  She does smoke an occasional cigarette.  There is no family history of premature coronary atherosclerosis.  Past Medical History:  Diagnosis Date  . Chronic headaches   . Hyperlipidemia   . Hypertension   . Obesity     There are no problems to display for this patient.   Past Surgical History:  Procedure Laterality Date  . CESAREAN SECTION       OB History   No obstetric history on file.     No family history on file.  Social History   Tobacco Use  . Smoking status: Current Every Day Smoker    Packs/day: 0.00    Types: Cigarettes  . Smokeless tobacco: Never Used  Vaping Use  . Vaping Use: Never used  Substance Use Topics  . Alcohol use: Yes    Alcohol/week: 1.0 standard drink    Types: 1 Glasses of wine per week  . Drug use: Yes    Frequency: 7.0 times per week    Types: Marijuana    Home Medications Prior to Admission medications   Medication Sig Start Date End Date Taking? Authorizing Provider  Aspirin-Acetaminophen-Caffeine (EXCEDRIN MIGRAINE PO) Take 2 tablets by mouth daily as needed (headaches).    [provider]  diltiazem (TIAZAC) 180 MG 24 hr capsule Take 180 mg by mouth daily. 06/12/17   [provider]  ferrous sulfate 325 (65 FE) MG tablet Take 1 tablet (325 mg total) by mouth daily. 01/23/19   Eber Hong, MD  hydrochlorothiazide  (HYDRODIURIL) 12.5 MG tablet Take 1 tablet (12.5 mg total) by mouth daily. 08/23/17   Samuel Jester, DO  hydrochlorothiazide (MICROZIDE) 12.5 MG capsule Take 1 capsule (12.5 mg total) by mouth daily. Patient not taking: Reported on 08/23/2017 06/09/15   Deborha Payment, PA-C  HYDROcodone-acetaminophen (NORCO/VICODIN) 5-325 MG tablet 1 or 2 tabs PO q8 hours prn pain 08/23/17   Samuel Jester, DO  ibuprofen (ADVIL,MOTRIN) 200 MG tablet Take 400 mg by mouth every 6 (six) hours as needed for moderate pain.    [provider]  methocarbamol (ROBAXIN) 500 MG tablet Take 2 tablets (1,000 mg total) by mouth 4 (four) times daily as needed for muscle spasms (muscle spasm/pain). 08/23/17   Samuel Jester, DO  Multiple Vitamins-Iron (DAILY MULTIPLE VITAMIN/IRON) TABS Take 1 tablet by mouth daily.    [provider]  Multiple Vitamins-Minerals (ONE-A-DAY WOMENS VITACRAVES) CHEW Chew 1 each by mouth daily.    [provider]    Allergies    Penicillins  Review of Systems   Review of Systems  Cardiovascular: Positive for chest pain.  All other systems reviewed and are negative.   Physical Exam Updated Vital Signs BP (!) 142/99 (BP Location: Right Arm)   Pulse 68   Temp 98 F (36.7 C) (Oral)   Resp 17   Ht 5'  6" (1.676 m)   Wt (!) 120.2 kg   LMP 08/12/2019 (Approximate)   SpO2 99%   BMI 42.77 kg/m   Physical Exam Vitals and nursing note reviewed.   39 year old female, resting comfortably and in no acute distress. Vital signs are significant for elevated blood pressure. Oxygen saturation is 99%, which is normal. Head is normocephalic and atraumatic. PERRLA, EOMI. Oropharynx is clear. Neck is nontender and supple without adenopathy or JVD. Back is nontender and there is no CVA tenderness. Lungs are clear without rales, wheezes, or rhonchi. Chest is nontender. Heart has regular rate and rhythm without murmur. Abdomen is soft, flat, nontender without masses or  hepatosplenomegaly and peristalsis is normoactive. Extremities have no cyanosis or edema, full range of motion is present. Skin is warm and dry without rash. Neurologic: Mental status is normal, cranial nerves are intact, there are no motor or sensory deficits.  ED Results / Procedures / Treatments   Labs (all labs ordered are listed, but only abnormal results are displayed) Labs Reviewed  BASIC METABOLIC PANEL - Abnormal; Notable for the following components:      Result Value   Sodium 134 (*)    Potassium 3.3 (*)    All other components within normal limits  CBC - Abnormal; Notable for the following components:   WBC 13.8 (*)    All other components within normal limits  HEPATIC FUNCTION PANEL  POC URINE PREG, ED  TROPONIN I (HIGH SENSITIVITY)  TROPONIN I (HIGH SENSITIVITY)    EKG EKG Interpretation  Date/Time:  Sunday September 08 2019 21:50:43 EDT Ventricular Rate:  75 PR Interval:  158 QRS Duration: 82 QT Interval:  378 QTC Calculation: 422 R Axis:   1 Text Interpretation: Normal sinus rhythm Normal ECG When compared with ECG of 08/23/2017, No significant change was found Confirmed by Dione Booze ([id number]) on 09/08/2019 11:19:59 PM   Radiology DG Chest 2 View  Result Date: 09/08/2019 CLINICAL DATA:  Chest tightness which began yesterday EXAM: CHEST - 2 VIEW COMPARISON:  Radiograph 05/01/2019, CT 08/21/2017 FINDINGS: Low lung volumes with basilar atelectatic changes. No consolidation, features of edema, pneumothorax, or effusion. Pulmonary vascularity is normally distributed. The cardiomediastinal contours are unremarkable for degree of inflation. No acute osseous or soft tissue abnormality. IMPRESSION: Atelectasis, otherwise no acute cardiopulmonary abnormality. Electronically Signed   By: Kreg Shropshire M.D.   On: 09/08/2019 22:44    Procedures Procedures   Medications Ordered in ED Medications  nitroGLYCERIN (NITROSTAT) SL tablet 0.4 mg (has no administration in time  range)  aspirin chewable tablet 324 mg (324 mg Oral Given 09/08/19 2351)  potassium chloride SA (KLOR-CON) CR tablet 40 mEq (40 mEq Oral Given 09/09/19 0054)    ED Course  I have reviewed the triage vital signs and the nursing notes.  Pertinent labs & imaging results that were available during my care of the patient were reviewed by me and considered in my medical decision making (see chart for details).  MDM Rules/Calculators/A&P Fatigue and chest discomfort of uncertain cause.  Pattern seems very atypical for ACS, even though she does have cardiac risk factors.  ECG is normal and chest x-ray is normal.  She will be given aspirin and given a trial of nitroglycerin.  Will check screening labs including troponin.  Old records are reviewed, and she does have a prior ED visit for chest wall pain.  She refused nitroglycerin.  Labs show mild hypokalemia and she is given a dose of oral potassium.  Also, she has mild leukocytosis which is nonspecific.  Suspect viral illness.  Troponin is normal x2.  She is feeling better at this point.  She is felt to be safe for discharge.  She is given a prescription for K. Dur.  Follow-up with PCP as needed.  Final Clinical Impression(s) / ED Diagnoses Final diagnoses:  Nonspecific chest pain  Hypokalemia    Rx / DC Orders ED Discharge Orders         Ordered    potassium chloride SA (KLOR-CON) 20 MEQ tablet  2 times daily     Discontinue  Reprint     09/09/19 0222           Dione Booze, MD 09/09/19 [phone]

## 2019-09-09 LAB — HEPATIC FUNCTION PANEL
ALT: 26 U/L (ref 0–44)
AST: 23 U/L (ref 15–41)
Albumin: 4.3 g/dL (ref 3.5–5.0)
Alkaline Phosphatase: 44 U/L (ref 38–126)
Bilirubin, Direct: 0.1 mg/dL (ref 0.0–0.2)
Indirect Bilirubin: 0.7 mg/dL (ref 0.3–0.9)
Total Bilirubin: 0.8 mg/dL (ref 0.3–1.2)
Total Protein: 7.4 g/dL (ref 6.5–8.1)

## 2019-09-09 LAB — BASIC METABOLIC PANEL
Anion gap: 8 (ref 5–15)
BUN: 17 mg/dL (ref 6–20)
CO2: 23 mmol/L (ref 22–32)
Calcium: 9.1 mg/dL (ref 8.9–10.3)
Chloride: 103 mmol/L (ref 98–111)
Creatinine, Ser: 0.87 mg/dL (ref 0.44–1.00)
GFR calc Af Amer: >60 mL/min (ref >60)
GFR calc non Af Amer: >60 mL/min (ref >60)
Glucose, Bld: 96 mg/dL (ref 70–99)
Potassium: 3.3 mmol/L — ABNORMAL LOW (ref 3.5–5.1)
Sodium: 134 mmol/L — ABNORMAL LOW (ref 135–145)

## 2019-09-09 LAB — TROPONIN I (HIGH SENSITIVITY)
Troponin I (High Sensitivity): 3 ng/L (ref <18)
Troponin I (High Sensitivity): 4 ng/L (ref <18)

## 2019-09-09 LAB — POC URINE PREG, ED: Preg Test, Ur: NEGATIVE

## 2019-09-09 MED ORDER — POTASSIUM CHLORIDE CRYS ER 20 MEQ PO TBCR
40.0000 meq | EXTENDED_RELEASE_TABLET | Freq: Once | ORAL | Status: AC
  Administered 2019-09-09: 40 meq via ORAL
  Filled 2019-09-09: qty 2

## 2019-09-09 MED ORDER — POTASSIUM CHLORIDE CRYS ER 20 MEQ PO TBCR: 20.0000 meq | EXTENDED_RELEASE_TABLET | Freq: Two times a day (BID) | ORAL | 0 refills | Status: DC

## 2019-09-09 NOTE — Discharge Instructions (Addendum)
Drink plenty of fluids.  Return if symptoms are getting worse. 

## 2019-11-29 ENCOUNTER — Ambulatory Visit: Payer: Self-pay

## 2019-12-13 ENCOUNTER — Ambulatory Visit: Payer: Self-pay

## 2020-09-09 ENCOUNTER — Other Ambulatory Visit (HOSPITAL_COMMUNITY): Payer: Self-pay | Admitting: Nurse Practitioner

## 2020-09-09 DIAGNOSIS — Z1231 Encounter for screening mammogram for malignant neoplasm of breast: Secondary | ICD-10-CM

## 2020-09-17 ENCOUNTER — Ambulatory Visit (HOSPITAL_COMMUNITY): Payer: Medicaid Other

## 2021-04-04 IMAGING — DX DG CHEST 2V
2 series · 2 of 2 positions shown · non-contrast
Comparison: Radiograph 05/01/2019, CT 08/21/2017

CLINICAL DATA: Chest tightness which began yesterday

EXAM:
CHEST - 2 VIEW

[chest pa]
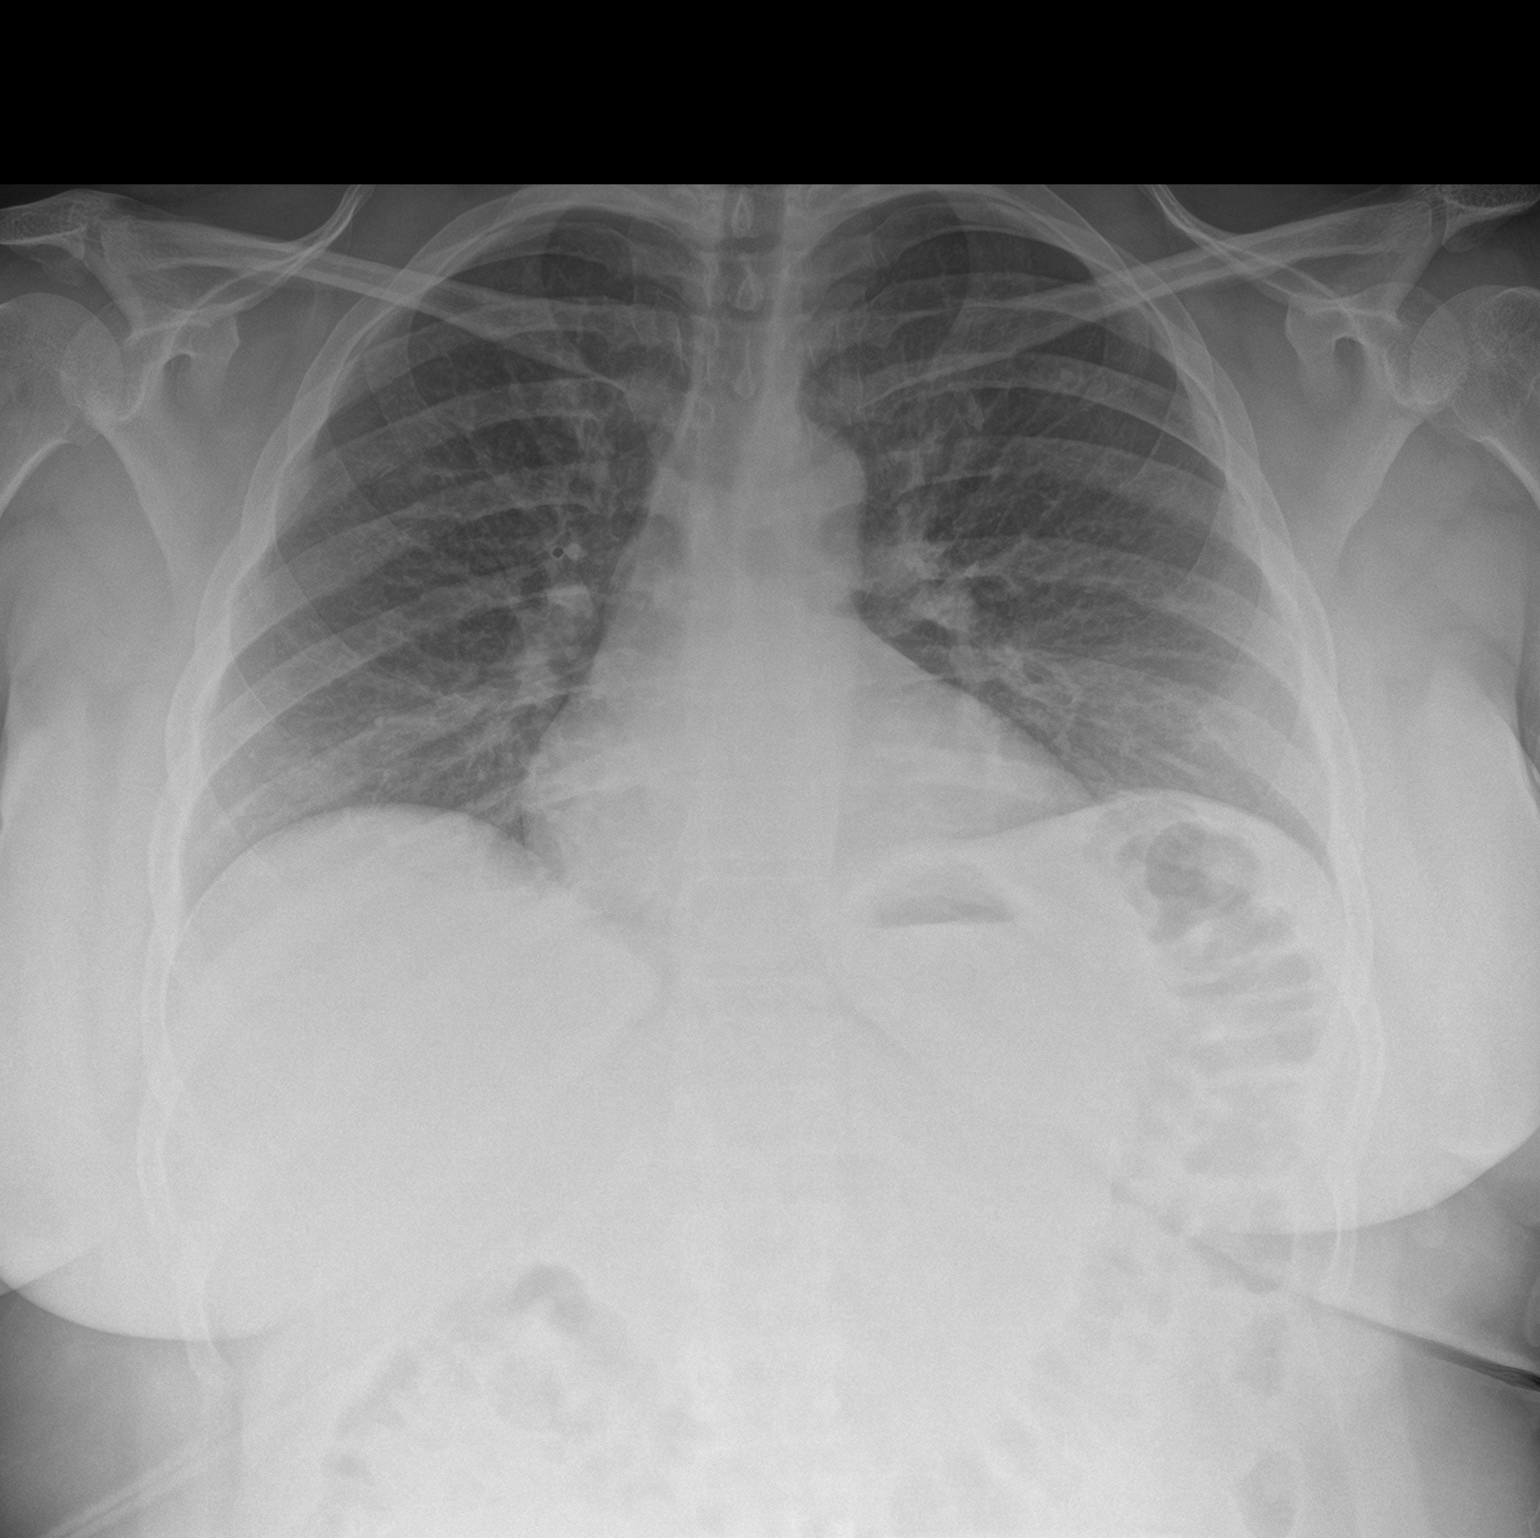

[chest lat]
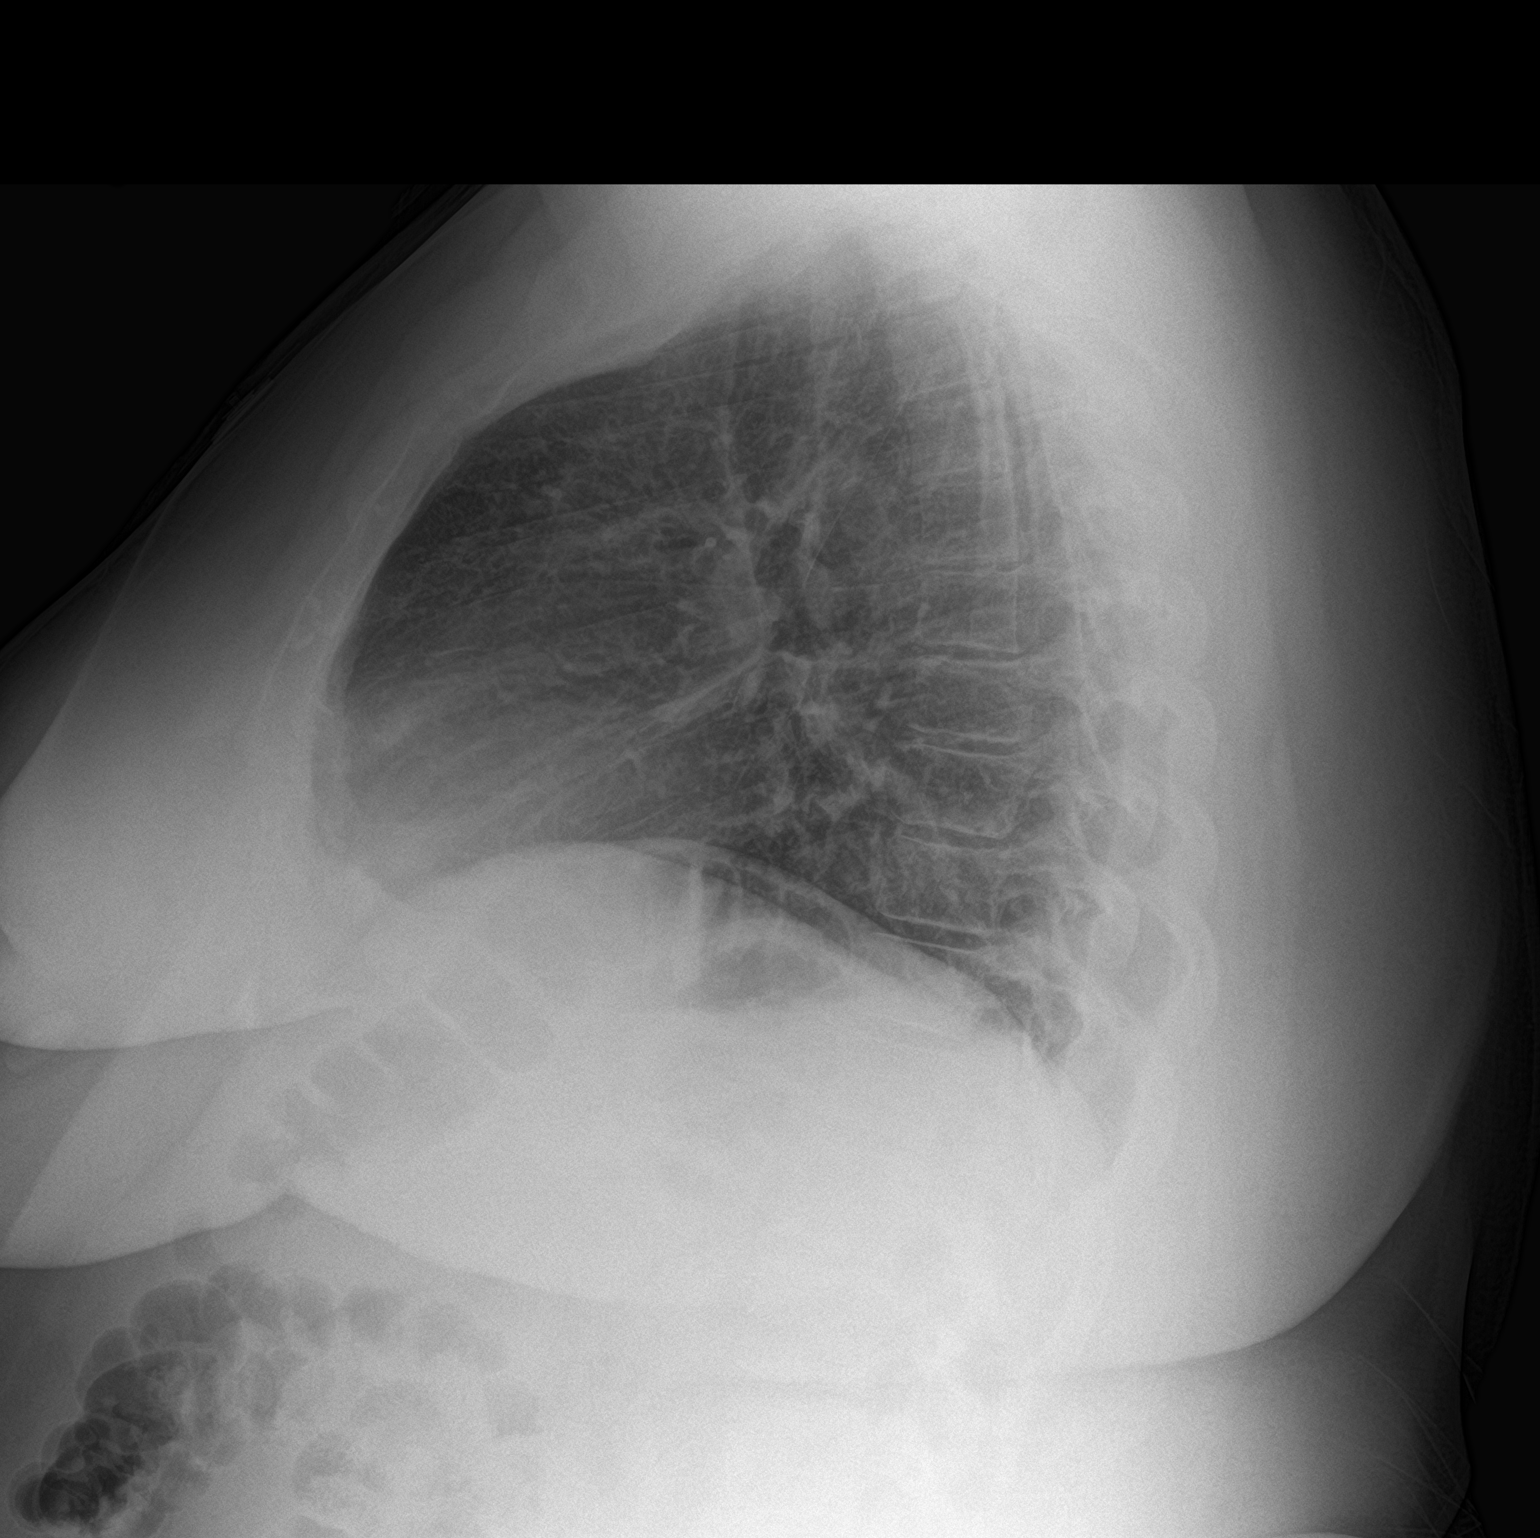

[2 of 2 positions shown; findings below may reference images not displayed]

FINDINGS: Low lung volumes with basilar atelectatic changes. No consolidation,
features of edema, pneumothorax, or effusion. Pulmonary vascularity
is normally distributed. The cardiomediastinal contours are
unremarkable for degree of inflation. No acute osseous or soft
tissue abnormality.
IMPRESSION: Atelectasis, otherwise no acute cardiopulmonary abnormality.

## 2021-09-16 ENCOUNTER — Other Ambulatory Visit (HOSPITAL_COMMUNITY): Payer: Self-pay | Admitting: *Deleted

## 2021-09-16 DIAGNOSIS — Z1231 Encounter for screening mammogram for malignant neoplasm of breast: Secondary | ICD-10-CM

## 2021-09-27 ENCOUNTER — Ambulatory Visit (HOSPITAL_COMMUNITY): Payer: BC Managed Care – PPO

## 2021-09-29 ENCOUNTER — Ambulatory Visit (HOSPITAL_COMMUNITY)
Admission: RE | Admit: 2021-09-29 | Discharge: 2021-09-29 | Disposition: A | Discharge status: Home / Self Care | Payer: BC Managed Care – PPO | Source: Ambulatory Visit | Attending: *Deleted | Admitting: *Deleted

## 2021-09-29 DIAGNOSIS — Z1231 Encounter for screening mammogram for malignant neoplasm of breast: Secondary | ICD-10-CM | POA: Diagnosis present

## 2021-10-04 ENCOUNTER — Other Ambulatory Visit (HOSPITAL_COMMUNITY): Payer: Self-pay | Admitting: Nurse Practitioner

## 2021-10-04 DIAGNOSIS — R928 Other abnormal and inconclusive findings on diagnostic imaging of breast: Secondary | ICD-10-CM

## 2021-10-05 ENCOUNTER — Other Ambulatory Visit (HOSPITAL_COMMUNITY): Payer: Self-pay | Admitting: *Deleted

## 2021-10-05 DIAGNOSIS — R928 Other abnormal and inconclusive findings on diagnostic imaging of breast: Secondary | ICD-10-CM

## 2021-10-13 ENCOUNTER — Ambulatory Visit (HOSPITAL_COMMUNITY)
Admission: RE | Admit: 2021-10-13 | Discharge: 2021-10-13 | Disposition: A | Discharge status: Home / Self Care | Payer: BC Managed Care – PPO | Source: Ambulatory Visit | Attending: Nurse Practitioner | Admitting: Nurse Practitioner

## 2021-10-13 DIAGNOSIS — R928 Other abnormal and inconclusive findings on diagnostic imaging of breast: Secondary | ICD-10-CM | POA: Diagnosis not present

## 2022-04-07 ENCOUNTER — Encounter (HOSPITAL_COMMUNITY): Payer: Self-pay | Admitting: Nurse Practitioner

## 2022-04-07 ENCOUNTER — Other Ambulatory Visit (HOSPITAL_COMMUNITY): Payer: Self-pay | Admitting: Nurse Practitioner

## 2022-04-07 DIAGNOSIS — N6320 Unspecified lump in the left breast, unspecified quadrant: Secondary | ICD-10-CM

## 2022-04-28 ENCOUNTER — Ambulatory Visit (HOSPITAL_COMMUNITY)
Admission: RE | Admit: 2022-04-28 | Discharge: 2022-04-28 | Disposition: A | Discharge status: Home / Self Care | Payer: BC Managed Care – PPO | Source: Ambulatory Visit | Attending: Nurse Practitioner | Admitting: Nurse Practitioner

## 2022-04-28 ENCOUNTER — Encounter (HOSPITAL_COMMUNITY): Payer: Self-pay

## 2022-04-28 DIAGNOSIS — N6320 Unspecified lump in the left breast, unspecified quadrant: Secondary | ICD-10-CM | POA: Diagnosis present

## 2022-09-12 ENCOUNTER — Other Ambulatory Visit: Payer: Self-pay | Admitting: Nurse Practitioner

## 2022-09-12 ENCOUNTER — Encounter: Payer: Self-pay | Admitting: Nurse Practitioner

## 2022-09-12 DIAGNOSIS — R928 Other abnormal and inconclusive findings on diagnostic imaging of breast: Secondary | ICD-10-CM

## 2022-09-12 DIAGNOSIS — N852 Hypertrophy of uterus: Secondary | ICD-10-CM

## 2022-10-05 ENCOUNTER — Ambulatory Visit
Admission: RE | Admit: 2022-10-05 | Discharge: 2022-10-05 | Disposition: A | Discharge status: Home / Self Care | Payer: BC Managed Care – PPO | Source: Ambulatory Visit | Attending: Nurse Practitioner | Admitting: Nurse Practitioner

## 2022-10-05 DIAGNOSIS — N6325 Unspecified lump in the left breast, overlapping quadrants: Secondary | ICD-10-CM | POA: Diagnosis not present

## 2022-10-05 DIAGNOSIS — R928 Other abnormal and inconclusive findings on diagnostic imaging of breast: Secondary | ICD-10-CM

## 2022-10-05 DIAGNOSIS — N838 Other noninflammatory disorders of ovary, fallopian tube and broad ligament: Secondary | ICD-10-CM | POA: Diagnosis not present

## 2022-10-05 DIAGNOSIS — N6324 Unspecified lump in the left breast, lower inner quadrant: Secondary | ICD-10-CM | POA: Diagnosis not present

## 2022-10-05 DIAGNOSIS — D259 Leiomyoma of uterus, unspecified: Secondary | ICD-10-CM | POA: Diagnosis not present

## 2022-10-05 DIAGNOSIS — N852 Hypertrophy of uterus: Secondary | ICD-10-CM

## 2022-12-01 ENCOUNTER — Other Ambulatory Visit: Payer: Self-pay | Admitting: Nurse Practitioner

## 2022-12-01 DIAGNOSIS — N838 Other noninflammatory disorders of ovary, fallopian tube and broad ligament: Secondary | ICD-10-CM

## 2022-12-29 ENCOUNTER — Other Ambulatory Visit: Payer: BC Managed Care – PPO

## 2023-01-13 ENCOUNTER — Other Ambulatory Visit: Payer: BC Managed Care – PPO

## 2023-01-19 ENCOUNTER — Ambulatory Visit
Admission: RE | Admit: 2023-01-19 | Discharge: 2023-01-19 | Disposition: A | Discharge status: Home / Self Care | Payer: BC Managed Care – PPO | Source: Ambulatory Visit | Attending: Nurse Practitioner | Admitting: Nurse Practitioner

## 2023-01-19 DIAGNOSIS — N838 Other noninflammatory disorders of ovary, fallopian tube and broad ligament: Secondary | ICD-10-CM

## 2023-01-19 DIAGNOSIS — D259 Leiomyoma of uterus, unspecified: Secondary | ICD-10-CM | POA: Diagnosis not present

## 2023-02-09 DIAGNOSIS — M25561 Pain in right knee: Secondary | ICD-10-CM | POA: Diagnosis not present

## 2023-02-09 DIAGNOSIS — E785 Hyperlipidemia, unspecified: Secondary | ICD-10-CM | POA: Diagnosis not present

## 2023-02-09 DIAGNOSIS — I10 Essential (primary) hypertension: Secondary | ICD-10-CM | POA: Diagnosis not present

## 2023-02-09 DIAGNOSIS — J309 Allergic rhinitis, unspecified: Secondary | ICD-10-CM | POA: Diagnosis not present

## 2023-03-21 ENCOUNTER — Encounter (INDEPENDENT_AMBULATORY_CARE_PROVIDER_SITE_OTHER): Payer: Self-pay | Admitting: Adult Health

## 2023-03-21 ENCOUNTER — Encounter (INDEPENDENT_AMBULATORY_CARE_PROVIDER_SITE_OTHER): Payer: Self-pay

## 2023-04-03 ENCOUNTER — Encounter (HOSPITAL_COMMUNITY): Payer: Self-pay

## 2023-04-03 ENCOUNTER — Other Ambulatory Visit: Payer: Self-pay

## 2023-04-03 ENCOUNTER — Emergency Department (HOSPITAL_COMMUNITY): Payer: BC Managed Care – PPO

## 2023-04-03 ENCOUNTER — Emergency Department (HOSPITAL_COMMUNITY)
Admission: EM | Admit: 2023-04-03 | Discharge: 2023-04-03 | Disposition: A | Discharge status: Home / Self Care | Payer: BC Managed Care – PPO

## 2023-04-03 DIAGNOSIS — R109 Unspecified abdominal pain: Secondary | ICD-10-CM | POA: Diagnosis not present

## 2023-04-03 DIAGNOSIS — I10 Essential (primary) hypertension: Secondary | ICD-10-CM | POA: Insufficient documentation

## 2023-04-03 DIAGNOSIS — Z79899 Other long term (current) drug therapy: Secondary | ICD-10-CM | POA: Diagnosis not present

## 2023-04-03 DIAGNOSIS — K7689 Other specified diseases of liver: Secondary | ICD-10-CM | POA: Diagnosis not present

## 2023-04-03 LAB — URINALYSIS, ROUTINE W REFLEX MICROSCOPIC
Bilirubin Urine: NEGATIVE
Glucose, UA: NEGATIVE mg/dL
Hgb urine dipstick: NEGATIVE
Ketones, ur: NEGATIVE mg/dL
Leukocytes,Ua: NEGATIVE
Nitrite: NEGATIVE
Protein, ur: NEGATIVE mg/dL
Specific Gravity, Urine: 1.018 (ref 1.005–1.030)
pH: 6 (ref 5.0–8.0)

## 2023-04-03 LAB — CBC WITH DIFFERENTIAL/PLATELET
Abs Immature Granulocytes: 0.01 10*3/uL (ref 0.00–0.07)
Basophils Absolute: 0.1 10*3/uL (ref 0.0–0.1)
Basophils Relative: 1 %
Eosinophils Absolute: 0.2 10*3/uL (ref 0.0–0.5)
Eosinophils Relative: 2 %
HCT: 39.8 % (ref 36.0–46.0)
Hemoglobin: 12.6 g/dL (ref 12.0–15.0)
Immature Granulocytes: 0 %
Lymphocytes Relative: 17 %
Lymphs Abs: 1.5 10*3/uL (ref 0.7–4.0)
MCH: 27.5 pg (ref 26.0–34.0)
MCHC: 31.7 g/dL (ref 30.0–36.0)
MCV: 86.7 fL (ref 80.0–100.0)
Monocytes Absolute: 0.8 10*3/uL (ref 0.1–1.0)
Monocytes Relative: 10 %
Neutro Abs: 6.1 10*3/uL (ref 1.7–7.7)
Neutrophils Relative %: 70 %
Platelets: 251 10*3/uL (ref 150–400)
RBC: 4.59 MIL/uL (ref 3.87–5.11)
RDW: 13.7 % (ref 11.5–15.5)
WBC: 8.6 10*3/uL (ref 4.0–10.5)
nRBC: 0 % (ref 0.0–0.2)

## 2023-04-03 LAB — COMPREHENSIVE METABOLIC PANEL
ALT: 17 U/L (ref 0–44)
AST: 20 U/L (ref 15–41)
Albumin: 3.8 g/dL (ref 3.5–5.0)
Alkaline Phosphatase: 54 U/L (ref 38–126)
Anion gap: 7 (ref 5–15)
BUN: 12 mg/dL (ref 6–20)
CO2: 25 mmol/L (ref 22–32)
Calcium: 9 mg/dL (ref 8.9–10.3)
Chloride: 106 mmol/L (ref 98–111)
Creatinine, Ser: 0.73 mg/dL (ref 0.44–1.00)
GFR, Estimated: >60 mL/min (ref >60)
Glucose, Bld: 113 mg/dL — ABNORMAL HIGH (ref 70–99)
Potassium: 3.3 mmol/L — ABNORMAL LOW (ref 3.5–5.1)
Sodium: 138 mmol/L (ref 135–145)
Total Bilirubin: 1.9 mg/dL — ABNORMAL HIGH (ref 0.0–1.2)
Total Protein: 6.7 g/dL (ref 6.5–8.1)

## 2023-04-03 LAB — HCG, SERUM, QUALITATIVE: Preg, Serum: NEGATIVE

## 2023-04-03 LAB — LIPASE, BLOOD: Lipase: 30 U/L (ref 11–51)

## 2023-04-03 LAB — RESP PANEL BY RT-PCR (RSV, FLU A&B, COVID)  RVPGX2
Influenza A by PCR: NEGATIVE
Influenza B by PCR: NEGATIVE
Resp Syncytial Virus by PCR: NEGATIVE
SARS Coronavirus 2 by RT PCR: NEGATIVE

## 2023-04-03 MED ORDER — ONDANSETRON HCL 4 MG/2ML IJ SOLN
4.0000 mg | Freq: Once | INTRAMUSCULAR | Status: AC
  Administered 2023-04-03: 4 mg via INTRAVENOUS
  Filled 2023-04-03: qty 2

## 2023-04-03 MED ORDER — KETOROLAC TROMETHAMINE 10 MG PO TABS: 10.0000 mg | ORAL_TABLET | Freq: Four times a day (QID) | ORAL | 0 refills | Status: DC | PRN

## 2023-04-03 MED ORDER — MORPHINE SULFATE (PF) 4 MG/ML IV SOLN
4.0000 mg | Freq: Once | INTRAVENOUS | Status: AC
  Administered 2023-04-03: 4 mg via INTRAVENOUS
  Filled 2023-04-03: qty 1

## 2023-04-03 MED ORDER — POTASSIUM CHLORIDE CRYS ER 20 MEQ PO TBCR
40.0000 meq | EXTENDED_RELEASE_TABLET | Freq: Once | ORAL | Status: AC
  Administered 2023-04-03: 40 meq via ORAL
  Filled 2023-04-03: qty 2

## 2023-04-03 MED ORDER — KETOROLAC TROMETHAMINE 15 MG/ML IJ SOLN
15.0000 mg | Freq: Once | INTRAMUSCULAR | Status: AC
  Administered 2023-04-03: 15 mg via INTRAVENOUS
  Filled 2023-04-03: qty 1

## 2023-04-03 NOTE — ED Triage Notes (Signed)
 Pt c/o rt side pain radiating to back. Pt states pain starting over a week ago. Pt states also fever and chills son has been sick. Pt denies n/v/d or painful urination.

## 2023-04-03 NOTE — Discharge Instructions (Signed)
 Your workup today was reassuring.  Your symptoms may be due to a viral illness and as there are a lot of those going around right now.  Please take the Toradol as needed for pain and follow-up with your doctor.  Return to the ER for worsening symptoms.  There were some cysts noted on your liver.  This should not be causing any symptoms but you should follow-up with your doctor to discuss if any further workup is needed for this.

## 2023-04-03 NOTE — ED Provider Notes (Signed)
 [place] EMERGENCY DEPARTMENT AT [facility] Provider Note   CSN: [id number] Arrival date & time: 04/03/23  0636     History  Chief Complaint  Patient presents with   Flank Pain    Mia Norris is a 43 y.o. female.  43 year old female with past medical history of hypertension and hyperlipidemia presenting to the emergency department today with right flank pain.  Patient states this been going now for the past few weeks.  She states that the pain has been intermittent in character.  She reports normal bowel movements.  Denies any nausea or vomiting.  She denies any vaginal bleeding or discharge.  She came to the ER today for further evaluation regarding this and also because she has started to develop some chills and myalgias.  She states that her son is here with URI symptoms and she is concerned that she may have gotten sick from him.  She denies any nasal congestion or sore throat.  Reports very mild intermittent cough over the past 24 hours that is nonproductive.  She denies any fevers.   Flank Pain       Home Medications Prior to Admission medications   Medication Sig Start Date End Date Taking? Authorizing Provider  ketorolac (TORADOL) 10 MG tablet Take 1 tablet (10 mg total) by mouth every 6 (six) hours as needed. 04/03/23  Yes Durwin Glaze, MD  diltiazem [facility]) 180 MG 24 hr capsule Take 180 mg by mouth daily. 06/12/17   [provider]  ferrous sulfate 325 (65 FE) MG tablet Take 1 tablet (325 mg total) by mouth daily. 01/23/19   Eber Hong, MD  hydrochlorothiazide (HYDRODIURIL) 12.5 MG tablet Take 1 tablet (12.5 mg total) by mouth daily. 08/23/17   Samuel Jester, DO  hydrochlorothiazide (MICROZIDE) 12.5 MG capsule Take 1 capsule (12.5 mg total) by mouth daily. Patient not taking: Reported on 08/23/2017 06/09/15   Deborha Payment, PA-C  HYDROcodone-acetaminophen (NORCO/VICODIN) 5-325 MG tablet 1 or 2 tabs PO q8 hours prn pain 08/23/17   Samuel Jester, DO  methocarbamol (ROBAXIN) 500 MG tablet Take 2 tablets (1,000 mg total) by mouth 4 (four) times daily as needed for muscle spasms (muscle spasm/pain). 08/23/17   Samuel Jester, DO  Multiple Vitamins-Iron (DAILY MULTIPLE VITAMIN/IRON) TABS Take 1 tablet by mouth daily.    [provider]  Multiple Vitamins-Minerals (ONE-A-DAY WOMENS VITACRAVES) CHEW Chew 1 each by mouth daily.    [provider]  potassium chloride SA (KLOR-CON) 20 MEQ tablet Take 1 tablet (20 mEq total) by mouth 2 (two) times daily. 09/09/19   Dione Booze, MD      Allergies    Penicillins    Review of Systems   Review of Systems  Genitourinary:  Positive for flank pain.  All other systems reviewed and are negative.   Physical Exam Updated Vital Signs BP 109/62   Pulse (!) 49   Temp 98.4 F (36.9 C) (Oral)   Resp 16   Ht 5\' 6"  (1.676 m)   Wt 114.8 kg   LMP 02/16/2023 (Approximate)   SpO2 94%   BMI 40.84 kg/m  Physical Exam Vitals and nursing note reviewed.   Gen: NAD Eyes: PERRL, EOMI HEENT: no oropharyngeal swelling Neck: trachea midline Resp: clear to auscultation bilaterally Card: RRR, no murmurs, rubs, or gallops Abd: nontender, nondistended, right-sided CVA tenderness noted Extremities: no calf tenderness, no edema Vascular: 2+ radial pulses bilaterally, 2+ DP pulses bilaterally Skin: no rashes Psyc: acting appropriately  ED Results / Procedures / Treatments   Labs (all labs ordered are listed, but only abnormal results are displayed) Labs Reviewed  COMPREHENSIVE METABOLIC PANEL - Abnormal; Notable for the following components:      Result Value   Potassium 3.3 (*)    Glucose, Bld 113 (*)    Total Bilirubin 1.9 (*)    All other components within normal limits  RESP PANEL BY RT-PCR (RSV, FLU A&B, COVID)  [id number]  CBC WITH DIFFERENTIAL/PLATELET  LIPASE, BLOOD  URINALYSIS, ROUTINE W REFLEX MICROSCOPIC  HCG, SERUM, QUALITATIVE    EKG None  Radiology CT  ABDOMEN PELVIS WO CONTRAST Result Date: 04/03/2023 CLINICAL DATA:  Abdominal pain/flank pain. EXAM: CT ABDOMEN AND PELVIS WITHOUT CONTRAST TECHNIQUE: Multidetector CT imaging of the abdomen and pelvis was performed following the standard protocol without IV contrast. RADIATION DOSE REDUCTION: This exam was performed according to the departmental dose-optimization program which includes automated exposure control, adjustment of the mA and/or kV according to patient size and/or use of iterative reconstruction technique. COMPARISON:  None Available. FINDINGS: Lower chest: No acute abnormality. Hepatobiliary: There are multiple scattered liver cysts. The largest cyst is partially exophytic arising off of segment 3 of the lateral segment of left lobe measuring 12.0 x 8.0 cm, image 39/5. Gallbladder appears normal. No bile duct dilatation. Pancreas: Unremarkable. No pancreatic ductal dilatation or surrounding inflammatory changes. Spleen: Normal in size without focal abnormality. Adrenals/Urinary Tract: Normal adrenal glands. No nephrolithiasis, hydronephrosis or suspicious mass. Urinary bladder is unremarkable. Stomach/Bowel: Stomach appears normal. The appendix is visualized and is within normal limits. No bowel wall thickening, inflammation or distension. Vascular/Lymphatic: Mild aortic atherosclerosis. No aneurysm. No signs of abdominopelvic adenopathy. Reproductive: Small exophytic fibroid off the left side of uterine fundus measures 1.3 cm, image 66/2. Uterus otherwise unremarkable. No adnexal mass. Other: No ascites or focal fluid collections. No signs of pneumoperitoneum. Musculoskeletal: There is no acute or suspicious osseous findings. Osteitis pubis noted. Sclerosis is noted along both sides of the SI joints. L4-5 and L5-S1 degenerative disc disease. IMPRESSION: 1. No acute findings within the abdomen or pelvis. 2. Multiple scattered liver cysts. The largest cyst is partially exophytic arising off of segment 3  of the lateral segment of left lobe measuring 12.0 x 8.0 cm. 3. Small exophytic fibroid off the left side of uterine fundus measures 1.3 cm. 4. Osteitis pubis. 5.  Aortic Atherosclerosis (ICD10-I70.0). Electronically Signed   By: Signa Kell M.D.   On: 04/03/2023 09:08    Procedures Procedures    Medications Ordered in ED Medications  potassium chloride SA (KLOR-CON M) CR tablet 40 mEq (has no administration in time range)  ketorolac (TORADOL) 15 MG/ML injection 15 mg (has no administration in time range)  morphine (PF) 4 MG/ML injection 4 mg (4 mg Intravenous Given 04/03/23 0747)  ondansetron (ZOFRAN) injection 4 mg (4 mg Intravenous Given 04/03/23 0747)    ED Course/ Medical Decision Making/ A&P                                 Medical Decision Making 43 year old female with past medical history of hypertension and hyperlipidemia presenting to the emergency department today with flank pain.  I will further evaluate patient here with basic labs including LFTs and a lipase to evaluate for hepatobiliary pathology or pancreatitis.  Will obtain a Noncon CT scan to evaluate for ureterolithiasis.  Also obtain a urinalysis to evaluate for pyelonephritis.  Given her chills were also obtain RSV/COVID/flu swab but especially with her son having URI symptoms.  I will reevaluate for ultimate disposition.  Give her morphine Zofran for symptoms and reevaluate.  The patient's workup is reassuring.  Her son who is here did test positive for flu so I suspect that she likely does have flu in addition to the flank pain which seems to be going on a little longer.  CT imaging is reassuring.  The patient is given NSAIDs and is discharged with return precautions.  Amount and/or Complexity of Data Reviewed Labs: ordered. Radiology: ordered.  Risk Prescription drug management.          Final Clinical Impression(s) / ED Diagnoses Final diagnoses:  Flank pain    Rx / DC Orders ED Discharge Orders           Ordered    ketorolac (TORADOL) 10 MG tablet  Every 6 hours PRN        04/03/23 1043              Durwin Glaze, MD 04/03/23 1045

## 2023-04-04 ENCOUNTER — Other Ambulatory Visit: Payer: Self-pay

## 2023-04-04 ENCOUNTER — Encounter (HOSPITAL_COMMUNITY): Payer: Self-pay | Admitting: Emergency Medicine

## 2023-04-04 ENCOUNTER — Emergency Department (HOSPITAL_COMMUNITY)
Admission: EM | Admit: 2023-04-04 | Discharge: 2023-04-05 | Disposition: A | Discharge status: Home / Self Care | Payer: BC Managed Care – PPO | Attending: Emergency Medicine | Admitting: Emergency Medicine

## 2023-04-04 DIAGNOSIS — I10 Essential (primary) hypertension: Secondary | ICD-10-CM | POA: Insufficient documentation

## 2023-04-04 DIAGNOSIS — R109 Unspecified abdominal pain: Secondary | ICD-10-CM

## 2023-04-04 DIAGNOSIS — F17210 Nicotine dependence, cigarettes, uncomplicated: Secondary | ICD-10-CM | POA: Insufficient documentation

## 2023-04-04 DIAGNOSIS — K7689 Other specified diseases of liver: Secondary | ICD-10-CM

## 2023-04-04 DIAGNOSIS — R1031 Right lower quadrant pain: Secondary | ICD-10-CM | POA: Diagnosis not present

## 2023-04-04 MED ORDER — OXYCODONE HCL 5 MG PO TABS
5.0000 mg | ORAL_TABLET | Freq: Once | ORAL | Status: AC
  Administered 2023-04-05: 5 mg via ORAL
  Filled 2023-04-04: qty 1

## 2023-04-04 MED ORDER — KETOROLAC TROMETHAMINE 60 MG/2ML IM SOLN
60.0000 mg | Freq: Once | INTRAMUSCULAR | Status: AC
  Administered 2023-04-04: 60 mg via INTRAMUSCULAR
  Filled 2023-04-04: qty 2

## 2023-04-04 NOTE — ED Provider Triage Note (Signed)
 Emergency Medicine Provider Triage Evaluation Note  Mia Norris , a 43 y.o. female  was evaluated in triage.  Pt complains of right flank pain. Seen yesterday for same. Workup was reassuring. Patient has only taken one dose of NSAID prescribed yesterday.  Review of Systems  Positive: Right flank pain Negative: Nausea, vomiting, dysuria, blood in urine  Physical Exam  BP 132/78 (BP Location: Right Arm)   Pulse 71   Temp 99.1 F (37.3 C) (Oral)   Resp 16   Ht 5\' 6"  (1.676 m)   Wt 114 kg   LMP 02/16/2023 (Approximate)   SpO2 100%   BMI 40.56 kg/m  Gen:   Awake, no distress   Resp:  Normal effort  MSK:   Moves extremities without difficulty  Other:  Pain localized to right flank, appears muscular in origin  Medical Decision Making  Medically screening exam initiated at 10:50 PM.  Appropriate orders placed.  Marjo Grosvenor was informed that the remainder of the evaluation will be completed by another provider, this initial triage assessment does not replace that evaluation, and the importance of remaining in the ED until their evaluation is complete.     Felicie Morn, NP 04/04/23 2253

## 2023-04-04 NOTE — ED Notes (Signed)
 Called lab to check status of urine. They advised that the lib was not on completely and the urine was in the bag. New sample is needed. Patient given water in attempt to accelerate the process.

## 2023-04-04 NOTE — ED Triage Notes (Signed)
 Pt is back today for the same rt side pain radiating to her back. Pt is back today for increased pain. Pt endorses nausea and diarrhea. Pt reports pain of 9/10.

## 2023-04-05 LAB — URINALYSIS, ROUTINE W REFLEX MICROSCOPIC
Bilirubin Urine: NEGATIVE
Glucose, UA: NEGATIVE mg/dL
Hgb urine dipstick: NEGATIVE
Ketones, ur: 5 mg/dL — AB
Leukocytes,Ua: NEGATIVE
Nitrite: NEGATIVE
Protein, ur: 30 mg/dL — AB
Specific Gravity, Urine: 1.032 — ABNORMAL HIGH (ref 1.005–1.030)
pH: 5 (ref 5.0–8.0)

## 2023-04-05 MED ORDER — OXYCODONE HCL 5 MG PO TABS: 5.0000 mg | ORAL_TABLET | ORAL | 0 refills | Status: DC | PRN

## 2023-04-05 NOTE — Discharge Instructions (Addendum)
 You were evaluated in the Emergency Department and after careful evaluation, we did not find any emergent condition requiring admission or further testing in the hospital.  Your exam/testing today is overall reassuring.  Your pain may be related to a large cyst on your liver.  We discussed this with our gastroenterology specialist, they would like to see you in the office.  You may need surgery for this cyst.  Recommend following up with [facility] general surgery department.  Recommend calling [phone] to schedule an appointment.  Recommend Tylenol 1000 mg every 4-6 hours and/or Motrin 600 mg every 4-6 hours for pain.  You can use the oxycodone medication for more significant pain.  Please return to the Emergency Department if you experience any worsening of your condition.   Thank you for allowing [place] to be a part of your care.

## 2023-04-05 NOTE — ED Provider Notes (Signed)
 AP-EMERGENCY DEPT [facility] Emergency Department Provider Note MRN:  [id number]  Arrival date & time: 04/05/23     Chief Complaint   Flank Pain   History of Present Illness   Mia Norris is a 43 y.o. year-old female with a history of hypertension presenting to the ED with chief complaint of flank pain.  Right flank pain for the past few weeks, intermittent but becoming more constant and more severe.  Denies fever.  Review of Systems  A thorough review of systems was obtained and all systems are negative except as noted in the HPI and PMH.   Patient's Health History    Past Medical History:  Diagnosis Date   Chronic headaches    Hyperlipidemia    Hypertension    Obesity     Past Surgical History:  Procedure Laterality Date   CESAREAN SECTION      History reviewed. No pertinent family history.  Social History   Socioeconomic History   Marital status: Single    Spouse name: Not on file   Number of children: Not on file   Years of education: Not on file   Highest education level: Not on file  Occupational History   Not on file  Tobacco Use   Smoking status: Every Day    Types: Cigarettes   Smokeless tobacco: Never  Vaping Use   Vaping status: Never Used  Substance and Sexual Activity   Alcohol use: Yes    Alcohol/week: 1.0 standard drink of alcohol    Types: 1 Glasses of wine per week   Drug use: Yes    Frequency: 7.0 times per week    Types: Marijuana   Sexual activity: Not on file  Other Topics Concern   Not on file  Social History Narrative   Not on file   Social Drivers of Health   Financial Resource Strain: Not on file  Food Insecurity: No Food Insecurity (11/19/2021)   Received from [facility], [facility]   Hunger Vital Sign    Worried About Running Out of Food in the Last Year: Never true    Ran Out of Food in the Last Year: Never true  Transportation Needs: Not on file  Physical Activity: Not on file  Stress: Not on file   Social Connections: Unknown (07/13/2021)   Received from [facility], [facility]   Social Network    Social Network: Not on file  Intimate Partner Violence: Unknown (07/13/2021)   Received from [facility], [facility]   HITS    Physically Hurt: Not on file    Insult or Talk Down To: Not on file    Threaten Physical Harm: Not on file    Scream or Curse: Not on file     Physical Exam   Vitals:   04/04/23 2159 04/04/23 2345  BP: 132/78 105/73  Pulse: 71 60  Resp: 16 18  Temp: 99.1 F (37.3 C)   SpO2: 100% 100%    CONSTITUTIONAL: Well-appearing, NAD NEURO/PSYCH:  Alert and oriented x 3, no focal deficits EYES:  eyes equal and reactive ENT/NECK:  no LAD, no JVD CARDIO: Regular rate, well-perfused, normal S1 and S2 PULM:  CTAB no wheezing or rhonchi GI/GU:  non-distended, non-tender MSK/SPINE:  No gross deformities, no edema SKIN:  no rash, atraumatic   *Additional and/or pertinent findings included in MDM below  Diagnostic and Interventional Summary    EKG Interpretation Date/Time:    Ventricular Rate:    PR Interval:  QRS Duration:    QT Interval:    QTC Calculation:   R Axis:      Text Interpretation:         Labs Reviewed  URINALYSIS, ROUTINE W REFLEX MICROSCOPIC - Abnormal; Notable for the following components:      Result Value   Color, Urine AMBER (*)    APPearance HAZY (*)    Specific Gravity, Urine 1.032 (*)    Ketones, ur 5 (*)    Protein, ur 30 (*)    Bacteria, UA RARE (*)    All other components within normal limits    No orders to display    Medications  ketorolac (TORADOL) injection 60 mg (60 mg Intramuscular Given 04/04/23 2204)  oxyCODONE (Oxy IR/ROXICODONE) immediate release tablet 5 mg (5 mg Oral Given 04/05/23 0006)     Procedures  /  Critical Care Procedures  ED Course and Medical Decision Making  Initial Impression and Ddx Patient was seen here yesterday for flank pain and explains that the pain continues and does not  know what else to do.  On the CT scan yesterday there is a large liver cyst, and the concern is that she is becoming symptomatic due to its size.  Could simply be incidental and her pain is MSK however will reach out to GI to see what can be done about this cyst or at least establish FOLLOW-up for her.  Past medical/surgical history that increases complexity of ED encounter: Hypertension  Interpretation of Diagnostics I personally reviewed the recent CT imaging and my interpretation is as follows: Large liver cyst, 12 x 8 cm  Urinalysis today unremarkable.  Patient Reassessment and Ultimate Disposition/Management     Recent imaging discussed with Dr. Levon Hedger of gastroenterology, this cyst could be causing symptoms, may need to be fully removed with partial liver resection.  Nothing needs to be done urgently.  Would recommend follow-up at a liver center such as [facility], can also follow-up with gastroenterology in the outpatient setting.  Patient is agreeable with this plan.  Patient management required discussion with the following services or consulting groups:  Gastroenterology  Complexity of Problems Addressed Acute illness or injury that poses threat of life of bodily function  Additional Data Reviewed and Analyzed Further history obtained from: Prior labs/imaging results  Additional Factors Impacting ED Encounter Risk Consideration of hospitalization  Elmer Sow. Pilar Plate, MD [facility] Emergency Medicine [facility] [email or web address]  Final Clinical Impressions(s) / ED Diagnoses     ICD-10-CM   1. Liver cyst  K76.89     2. Flank pain  R10.9       ED Discharge Orders          Ordered    oxyCODONE (ROXICODONE) 5 MG immediate release tablet  Every 4 hours PRN        04/05/23 0157             Discharge Instructions Discussed with and Provided to Patient:     Discharge Instructions      You were evaluated in the Emergency Department and after  careful evaluation, we did not find any emergent condition requiring admission or further testing in the hospital.  Your exam/testing today is overall reassuring.  Your pain may be related to a large cyst on your liver.  We discussed this with our gastroenterology specialist, they would like to see you in the office.  You may need surgery for this cyst.  Recommend following up with [facility] general surgery department.  Recommend calling [phone] to schedule an appointment.  Recommend Tylenol 1000 mg every 4-6 hours and/or Motrin 600 mg every 4-6 hours for pain.  You can use the oxycodone medication for more significant pain.  Please return to the Emergency Department if you experience any worsening of your condition.   Thank you for allowing [place] to be a part of your care.       Sabas Sous, MD 04/05/23 [phone]

## 2023-04-07 ENCOUNTER — Encounter (INDEPENDENT_AMBULATORY_CARE_PROVIDER_SITE_OTHER): Payer: Self-pay

## 2023-04-10 ENCOUNTER — Encounter (INDEPENDENT_AMBULATORY_CARE_PROVIDER_SITE_OTHER): Payer: Self-pay | Admitting: Gastroenterology

## 2023-04-10 ENCOUNTER — Ambulatory Visit (INDEPENDENT_AMBULATORY_CARE_PROVIDER_SITE_OTHER): Admitting: Gastroenterology

## 2023-04-10 VITALS — BP 112/74 | HR 71 | Temp 97.8°F | Ht 66.0 in | Wt 254.0 lb

## 2023-04-10 DIAGNOSIS — K7689 Other specified diseases of liver: Secondary | ICD-10-CM | POA: Insufficient documentation

## 2023-04-10 DIAGNOSIS — R1011 Right upper quadrant pain: Secondary | ICD-10-CM | POA: Diagnosis not present

## 2023-04-10 DIAGNOSIS — R110 Nausea: Secondary | ICD-10-CM | POA: Insufficient documentation

## 2023-04-10 NOTE — Patient Instructions (Signed)
 We will get you scheduled for an upper endoscopy to rule out other causes of your abdominal pain If this is negative, we will [facility] of the liver cyst noted on your recent CT For now, you can continue to use tylenol and the pain medication given to you in the ER for you pain  Follow up 3 months  It was a pleasure to see you today. I want to create trusting relationships with patients and provide genuine, compassionate, and quality care. I truly value your feedback! please be on the lookout for a survey regarding your visit with me today. I appreciate your input about our visit and your time in completing this!    Mia Norris L. Jeanmarie Hubert, MSN, APRN, AGNP-C Adult-Gerontology Nurse Practitioner [facility] Gastroenterology at [facility]

## 2023-04-10 NOTE — Progress Notes (Signed)
 Referring Provider: Health, Matthew Folks* Primary Care Physician:  [facility], [facility] Primary GI Physician: New (Dr. Levon Hedger)  Chief Complaint  Patient presents with   Abdominal Pain    Referred for RUQ pain and cyst on liver. Taking ibuprofen while at work and oxycodone while at home for pain.    HPI:   Mia Norris is a 43 y.o. female with past medical history of chronic headaches, HLD, HTN   Patient presenting today as a new patient for: RUQ pain Nausea Large Liver cyst  Patient seen in the ED on 04/05/23 for flank pain. CT A/P at that time showed multiple liver cyst with largest being 8x12 cm, thought possibly the cause of her pain as other workup was grossly unremarkable  CMP at that time with T bili 1.9, CBC and Lipase WNL  Present:  Patient reports pain in RUQ for about 2 month, maybe a bit longer. She reports pain radiates into her back as well. Does not think eating changes her pain. She notes that she was taking her son to the ER because he was sick and decided to be seen herself due to ongoing RUQ pain. She [facility] and notes she has had cysts on her ovaries as well noted on recent US. Pain is present mostly all the time, she though initially she had pulled a muscle as she lifts a lot at work. Laying down seems to help relieve her pain some. She reports that the oxycodone she took from the ER helped to relieve the pain but she does not like to take medications unless she absolutely has to. She endorses some nausea but no vomiting. She has lost some weight but is going to the gym to try and manage her weight. She notes that she has had some early satiety recently but thinks this is due to being nervous about what is going on causing her RUQ pain. She has a BM usually twice per day. No changes in her bowel habits. Denies rectal bleeding or melena. She has 800mg  ibuprofen from the health department but takes these only  as needed. Denies other NSAID use.   No tobacco, etoh on occasion, smokes Marijuana sometimes Denies family history of CRC  or liver disease that she knows of.    CT A/P WO contrast: 04/03/23 No acute findings within the abdomen or pelvis. 2. Multiple scattered liver cysts. The largest cyst is partially exophytic arising off of segment 3 of the lateral segment of left lobe measuring 12.0 x 8.0 cm. 3. Small exophytic fibroid off the left side of uterine fundus measures 1.3 cm. 4. Osteitis pubis. 5.  Aortic Atherosclerosis  Last Colonoscopy: never  Last Endoscopy: never   Past Medical History:  Diagnosis Date   Chronic headaches    Hyperlipidemia    Hypertension    Obesity     Past Surgical History:  Procedure Laterality Date   CESAREAN SECTION      Current Outpatient Medications  Medication Sig Dispense Refill   atorvastatin (LIPITOR) 40 MG tablet Take 40 mg by mouth at bedtime.     hydrochlorothiazide (HYDRODIURIL) 25 MG tablet Take 25 mg by mouth daily.     losartan (COZAAR) 50 MG tablet Take 50 mg by mouth daily.     Multiple Vitamins-Minerals (ONE-A-DAY WOMENS VITACRAVES) CHEW Chew 1 each by mouth daily.     oxyCODONE (ROXICODONE) 5 MG immediate release tablet Take 1 tablet (5 mg total) by mouth  every 4 (four) hours as needed for severe pain (pain score 7-10). 12 tablet 0   No current facility-administered medications for this visit.    Allergies as of 04/10/2023 - Review Complete 04/04/2023  Allergen Reaction Noted   Penicillins  06/09/2015    No family history on file.  Social History   Socioeconomic History   Marital status: Single    Spouse name: Not on file   Number of children: Not on file   Years of education: Not on file   Highest education level: Not on file  Occupational History   Not on file  Tobacco Use   Smoking status: Every Day    Types: Cigarettes   Smokeless tobacco: Never  Vaping Use   Vaping status: Never Used  Substance and Sexual  Activity   Alcohol use: Yes    Alcohol/week: 1.0 standard drink of alcohol    Types: 1 Glasses of wine per week   Drug use: Yes    Frequency: 7.0 times per week    Types: Marijuana   Sexual activity: Not on file  Other Topics Concern   Not on file  Social History Narrative   Not on file   Social Drivers of Health   Financial Resource Strain: Not on file  Food Insecurity: No Food Insecurity (11/19/2021)   Received from [facility], [facility]   Hunger Vital Sign    Worried About Running Out of Food in the Last Year: Never true    Ran Out of Food in the Last Year: Never true  Transportation Needs: Not on file  Physical Activity: Not on file  Stress: Not on file  Social Connections: Unknown (07/13/2021)   Received from [facility], [facility]   Social Network    Social Network: Not on file    Review of systems General: negative for malaise, night sweats, fever, chills, weight loss Neck: Negative for lumps, goiter, pain and significant neck swelling Resp: Negative for cough, wheezing, dyspnea at rest CV: Negative for chest pain, leg swelling, palpitations, orthopnea GI: denies melena, hematochezia, vomiting, diarrhea, constipation, dysphagia, odyonophagia,  or unintentional weight loss.  + Right quadrant pain + nausea + early satiety MSK: Negative for joint pain or swelling, back pain, and muscle pain. Derm: Negative for itching or rash Psych: Denies depression, anxiety, memory loss, confusion. No homicidal or suicidal ideation.  Heme: Negative for prolonged bleeding, bruising easily, and swollen nodes. Endocrine: Negative for cold or heat intolerance, polyuria, polydipsia and goiter. Neuro: negative for tremor, gait imbalance, syncope and seizures. The remainder of the review of systems is noncontributory.  Physical Exam: LMP 02/16/2023 (Approximate)  General:   Alert and oriented. No distress noted. Pleasant and cooperative.  Head:  Normocephalic and  atraumatic. Eyes:  Conjuctiva clear without scleral icterus. Mouth:  Oral mucosa pink and moist. Good dentition. No lesions. Heart: Normal rate and rhythm, s1 and s2 heart sounds present.  Lungs: Clear lung sounds in all lobes. Respirations equal and unlabored. Abdomen:  +BS, soft, and non-distended.  Tenderness to palpation of mid to right upper quadrant.  No rebound or guarding. No HSM or masses noted. Derm: No palmar erythema or jaundice Msk:  Symmetrical without gross deformities. Normal posture. Extremities:  Without edema. Neurologic:  Alert and  oriented x4 Psych:  Alert and cooperative. Normal mood and affect.  Invalid input(s): "6 MONTHS"   ASSESSMENT: Mia Norris is a 43 y.o. female presenting today as a new patient for RUQ pain and nausea  Patient presents today with right upper quadrant and some nausea over the past 2+ months.  She denies frequent use of NSAIDs, rectal bleeding, melena, change in bowel habits, unintentional weight loss.  She does note some early satiety at times.  CT done and recent ER visit with large cyst liver measuring 12 x 8 cm.  Given size of cyst, this could potentially be causing some of her pain but would be important to rule out other etiologies given nausea and early satiety.  Would recommend proceeding with EGD for further evaluation and to confirm what PUD, gastritis, duodenitis, malignancy.  If EGD is unremarkable, we will proceed with referral to [facility] for surgical evaluation of possible cyst removal/partial resection of the liver.  For now she can continue with Tylenol and pain medication given to her in the ER as needed. Indications, risks and benefits of procedure discussed in detail with patient. Patient verbalized understanding and is in agreement to proceed with EGD.    PLAN:  -Schedule EGD, ASA 2 -Utilize Tylenol and pain medication given in the ER for pain as needed -Consider referral to [facility] for possible partial liver  resection/cyst removal if EGD unremarkable  All questions were answered, patient verbalized understanding and is in agreement with plan as outlined above.   Follow Up: 3 months  Mia Vasudevan L. Jeanmarie Hubert, MSN, APRN, AGNP-C Adult-Gerontology Nurse Practitioner [facility]  I have reviewed the note and agree with the APP's assessment as described in this progress note  I reviewed the images, I agree with large size of cyst on left lobe of the liver.  Will start evaluation with EGD but if no identifiable cause of pain, will refer her to [facility] for hepatobiliary surgery evaluation.  Katrinka Blazing, MD Gastroenterology and Hepatology [facility] Gastroenterology

## 2023-04-10 NOTE — H&P (View-Only) (Signed)
 Referring Provider: Health, Matthew Folks* Primary Care Physician:  Health, [facility] Primary GI Physician: New (Dr. Levon Hedger)  Chief Complaint  Patient presents with   Abdominal Pain    Referred for RUQ pain and cyst on liver. Taking ibuprofen while at work and oxycodone while at home for pain.    HPI:   Mia Norris is a 43 y.o. female with past medical history of chronic headaches, HLD, HTN   Patient presenting today as a new patient for: RUQ pain Nausea Large Liver cyst  Patient seen in the ED on 04/05/23 for flank pain. CT A/P at that time showed multiple liver cyst with largest being 8x12 cm, thought possibly the cause of her pain as other workup was grossly unremarkable  CMP at that time with T bili 1.9, CBC and Lipase WNL  Present:  Patient reports pain in RUQ for about 2 month, maybe a bit longer. She reports pain radiates into her back as well. Does not think eating changes her pain. She notes that she was taking her son to the ER because he was sick and decided to be seen herself due to ongoing RUQ pain. She [facility] and notes she has had cysts on her ovaries as well noted on recent US. Pain is present mostly all the time, she though initially she had pulled a muscle as she lifts a lot at work. Laying down seems to help relieve her pain some. She reports that the oxycodone she took from the ER helped to relieve the pain but she does not like to take medications unless she absolutely has to. She endorses some nausea but no vomiting. She has lost some weight but is going to the gym to try and manage her weight. She notes that she has had some early satiety recently but thinks this is due to being nervous about what is going on causing her RUQ pain. She has a BM usually twice per day. No changes in her bowel habits. Denies rectal bleeding or melena. She has 800mg  ibuprofen from the health department but takes these only  as needed. Denies other NSAID use.   No tobacco, etoh on occasion, smokes Marijuana sometimes Denies family history of CRC  or liver disease that she knows of.    CT A/P WO contrast: 04/03/23 No acute findings within the abdomen or pelvis. 2. Multiple scattered liver cysts. The largest cyst is partially exophytic arising off of segment 3 of the lateral segment of left lobe measuring 12.0 x 8.0 cm. 3. Small exophytic fibroid off the left side of uterine fundus measures 1.3 cm. 4. Osteitis pubis. 5.  Aortic Atherosclerosis  Last Colonoscopy: never  Last Endoscopy: never   Past Medical History:  Diagnosis Date   Chronic headaches    Hyperlipidemia    Hypertension    Obesity     Past Surgical History:  Procedure Laterality Date   CESAREAN SECTION      Current Outpatient Medications  Medication Sig Dispense Refill   atorvastatin (LIPITOR) 40 MG tablet Take 40 mg by mouth at bedtime.     hydrochlorothiazide (HYDRODIURIL) 25 MG tablet Take 25 mg by mouth daily.     losartan (COZAAR) 50 MG tablet Take 50 mg by mouth daily.     Multiple Vitamins-Minerals (ONE-A-DAY WOMENS VITACRAVES) CHEW Chew 1 each by mouth daily.     oxyCODONE (ROXICODONE) 5 MG immediate release tablet Take 1 tablet (5 mg total) by mouth  every 4 (four) hours as needed for severe pain (pain score 7-10). 12 tablet 0   No current facility-administered medications for this visit.    Allergies as of 04/10/2023 - Review Complete 04/04/2023  Allergen Reaction Noted   Penicillins  06/09/2015    No family history on file.  Social History   Socioeconomic History   Marital status: Single    Spouse name: Not on file   Number of children: Not on file   Years of education: Not on file   Highest education level: Not on file  Occupational History   Not on file  Tobacco Use   Smoking status: Every Day    Types: Cigarettes   Smokeless tobacco: Never  Vaping Use   Vaping status: Never Used  Substance and Sexual  Activity   Alcohol use: Yes    Alcohol/week: 1.0 standard drink of alcohol    Types: 1 Glasses of wine per week   Drug use: Yes    Frequency: 7.0 times per week    Types: Marijuana   Sexual activity: Not on file  Other Topics Concern   Not on file  Social History Narrative   Not on file   Social Drivers of Health   Financial Resource Strain: Not on file  Food Insecurity: No Food Insecurity (11/19/2021)   Received [facility], [facility]   Hunger Vital Sign    Worried About Running Out of Food in the Last Year: Never true    Ran Out of Food in the Last Year: Never true  Transportation Needs: Not on file  Physical Activity: Not on file  Stress: Not on file  Social Connections: Unknown (07/13/2021)   Received from [facility], [facility]   Social Network    Social Network: Not on file    Review of systems General: negative for malaise, night sweats, fever, chills, weight loss Neck: Negative for lumps, goiter, pain and significant neck swelling Resp: Negative for cough, wheezing, dyspnea at rest CV: Negative for chest pain, leg swelling, palpitations, orthopnea GI: denies melena, hematochezia, vomiting, diarrhea, constipation, dysphagia, odyonophagia,  or unintentional weight loss.  + Right quadrant pain + nausea + early satiety MSK: Negative for joint pain or swelling, back pain, and muscle pain. Derm: Negative for itching or rash Psych: Denies depression, anxiety, memory loss, confusion. No homicidal or suicidal ideation.  Heme: Negative for prolonged bleeding, bruising easily, and swollen nodes. Endocrine: Negative for cold or heat intolerance, polyuria, polydipsia and goiter. Neuro: negative for tremor, gait imbalance, syncope and seizures. The remainder of the review of systems is noncontributory.  Physical Exam: LMP 02/16/2023 (Approximate)  General:   Alert and oriented. No distress noted. Pleasant and cooperative.  Head:  Normocephalic and  atraumatic. Eyes:  Conjuctiva clear without scleral icterus. Mouth:  Oral mucosa pink and moist. Good dentition. No lesions. Heart: Normal rate and rhythm, s1 and s2 heart sounds present.  Lungs: Clear lung sounds in all lobes. Respirations equal and unlabored. Abdomen:  +BS, soft, and non-distended.  Tenderness to palpation of mid to right upper quadrant.  No rebound or guarding. No HSM or masses noted. Derm: No palmar erythema or jaundice Msk:  Symmetrical without gross deformities. Normal posture. Extremities:  Without edema. Neurologic:  Alert and  oriented x4 Psych:  Alert and cooperative. Normal mood and affect.  Invalid input(s): "6 MONTHS"   ASSESSMENT: Mia Norris is a 43 y.o. female presenting today as a new patient for RUQ pain and nausea  Patient presents today with right upper quadrant and some nausea over the past 2+ months.  She denies frequent use of NSAIDs, rectal bleeding, melena, change in bowel habits, unintentional weight loss.  She does note some early satiety at times.  CT done and recent ER visit with large cyst liver measuring 12 x 8 cm.  Given size of cyst, this could potentially be causing some of her pain but would be important to rule out other etiologies given nausea and early satiety.  Would recommend proceeding with EGD for further evaluation and to confirm what PUD, gastritis, duodenitis, malignancy.  If EGD is unremarkable, we will proceed with referral to [facility] for surgical evaluation of possible cyst removal/partial resection of the liver.  For now she can continue with Tylenol and pain medication given to her in the ER as needed. Indications, risks and benefits of procedure discussed in detail with patient. Patient verbalized understanding and is in agreement to proceed with EGD.    PLAN:  -Schedule EGD, ASA 2 -Utilize Tylenol and pain medication given in the ER for pain as needed -Consider referral to [facility] for possible partial liver  resection/cyst removal if EGD unremarkable  All questions were answered, patient verbalized understanding and is in agreement with plan as outlined above.   Follow Up: 3 months  Mia Vasudevan L. Jeanmarie Hubert, MSN, APRN, AGNP-C Adult-Gerontology Nurse Practitioner [facility]  I have reviewed the note and agree with the APP's assessment as described in this progress note  I reviewed the images, I agree with large size of cyst on left lobe of the liver.  Will start evaluation with EGD but if no identifiable cause of pain, will refer her to [facility] for hepatobiliary surgery evaluation.  Katrinka Blazing, MD Gastroenterology and Hepatology [facility]

## 2023-04-13 ENCOUNTER — Encounter (INDEPENDENT_AMBULATORY_CARE_PROVIDER_SITE_OTHER): Payer: Self-pay | Admitting: Family Medicine

## 2023-04-13 ENCOUNTER — Ambulatory Visit (INDEPENDENT_AMBULATORY_CARE_PROVIDER_SITE_OTHER): Payer: Self-pay | Admitting: Family Medicine

## 2023-04-13 VITALS — BP 105/69 | HR 61 | Temp 98.1°F | Ht 66.0 in | Wt 251.0 lb

## 2023-04-13 DIAGNOSIS — E66813 Obesity, class 3: Secondary | ICD-10-CM | POA: Diagnosis not present

## 2023-04-13 DIAGNOSIS — Z6841 Body Mass Index (BMI) 40.0 and over, adult: Secondary | ICD-10-CM

## 2023-04-13 DIAGNOSIS — I10 Essential (primary) hypertension: Secondary | ICD-10-CM | POA: Diagnosis not present

## 2023-04-13 DIAGNOSIS — E785 Hyperlipidemia, unspecified: Secondary | ICD-10-CM | POA: Diagnosis not present

## 2023-04-13 NOTE — Progress Notes (Signed)
 Mia Grippe, DO, ABFM, ABOM Bariatric physician [address] [place], [place], [place] [address] Office: [phone]  /  Fax: [phone]    Initial Evaluation:  Mia Norris was seen in clinic today to evaluate for obesity. She is interested in losing weight to improve overall health and reduce the risk of weight related complications. She presents today to review program treatment options, initial physical assessment, and evaluation.     She was referred by: PCP - Mia Furnish, PA  When asked how has your weight affected you? She states: Contributed to medical problems, Contributed to orthopedic problems or mobility issues, Having fatigue, and Having poor endurance  Contributing factors to her weight change: Reduced physical activity, Eating patterns, and nutritional  Some associated conditions: Hypertension, Arthritis:right knee, Hyperlipidemia, and Hx of fatty liver - recent CT of abdomen and pelvis on 04/03/2023 showed no evidence of hepatosteatosis.  Current nutrition plan: Portion control / smart choices & recently started focusing on having baked or boiled foods.   Current level of physical activity: walking on treadmill at gym 30 minutes, 2 days a week.   Current or previous pharmacotherapy: None, but did try OTC "wt loss supplements"   Response to medication: Never tried medications   Past Medical History:  Diagnosis Date   Chronic headaches    Hyperlipidemia    Hypertension    Obesity     Current Outpatient Medications  Medication Instructions   atorvastatin (LIPITOR) 40 mg, Daily at bedtime   hydrochlorothiazide (HYDRODIURIL) 25 mg, Daily   losartan (COZAAR) 50 mg, Daily   Multiple Vitamins-Minerals (ONE-A-DAY WOMENS VITACRAVES) CHEW 1 each, Daily   OVER THE COUNTER MEDICATION Vit b12  Elderberry   oxyCODONE (ROXICODONE) 5 mg, Oral, Every 4 hours PRN     Allergies  Allergen Reactions   Penicillins      Past Surgical History:  Procedure  Laterality Date   CESAREAN SECTION      History reviewed. No pertinent family history.   Objective:  BP 105/69   Pulse 61   Temp 98.1 F (36.7 C)   Ht 5\' 6"  (1.676 m)   Wt 251 lb (113.9 kg)   LMP 02/16/2023 (Approximate)   SpO2 99%   BMI 40.51 kg/m  She was weighed on the bioimpedance scale: Body mass index is 40.51 kg/m.  Visceral Fat %: 14, Body Fat %: 49.  No data recorded No data recorded   Vitals Temp: 98.1 F (36.7 C) BP: 105/69 Pulse Rate: 61 SpO2: 99 %   Anthropometric Measurements Height: 5\' 6"  (1.676 m) Weight: 251 lb (113.9 kg) BMI (Calculated): 40.53   Body Composition  Body Fat %: 49.1 % Fat Mass (lbs): 123.6 lbs Muscle Mass (lbs): 121.8 lbs Total Body Water (lbs): 87.6 lbs Visceral Fat Rating : 14   Other Clinical Data Fasting: No Labs: No Today's Visit #: Info Session Comments: Information Session    General: Well Developed, well nourished, and in no acute distress.  HEENT: Normocephalic, atraumatic; EOMI, sclerae are anicteric. Skin: Warm and dry, good turgor Chest:  Normal excursion, shape, no gross ABN Respiratory: No conversational dyspnea; speaking in full sentences NeuroM-Sk:  Normal gross ROM * 4 extremities Psych: A and O *3, insight adequate, mood- full    Assessment and Plan:   FOR THE DISEASE OF OBESITY:  Class 3 severe obesity due to excess calories with body mass index (BMI) of 40.0 to 44.9 in adult, unspecified whether serious comorbidity present [facility]) Assessment & Plan: We reviewed anthropometrics,  biometrics, associated medical conditions and contributing factors with patient.   Mia Norris would benefit from a medically tailored reduced calorie nutrional plan based on his REE (resting energy expenditure), which will be determined by indirect calorimetry.  We will also assess for cardiometabolic risk and nutritional derangements via fasting labs at intake appointment.    Obesity Treatment / Action Plan:   she was  weighed on the bioimpedance scale and results were discussed and documented in the synopsis.   Mia Norris will complete provided nutritional and psychosocial assessment questionnaire before the next appointment.  she will be scheduled for indirect calorimetry to determine resting energy expenditure in a fasting state.  This will allow [place] to create a reduced calorie, high-protein meal plan to promote loss of fat mass while preserving muscle mass.  We will also assess for cardiometabolic risk and nutritional derangements via an ECG and fasting serologies at her next appointment.  she was encouraged to work on amassing support from family and friends to begin their weight loss journey.   Work on eliminating or reducing the presence of highly processed, poorly nutritious, calorie-dense foods in the home.   Obesity Education Performed Today:  Patient was counseled on nutritional approaches to weight loss and benefits of reducing processed foods and consuming plant-based foods and high quality protein as part of nutritional weight management program.   We discussed the importance of long term lifestyle changes which include nutrition, exercise and behavioral modifications as well as the importance of customizing this to her specific health and social needs.   We discussed the benefits of reaching a healthier weight to alleviate the symptoms of existing conditions and reduce the risks of the biomechanical, metabolic and psychological effects of obesity.  Was counseled on the health benefits of losing 5%-10% of total body weight.  Was counseled on our cognitive behavorial therapy program, lead by our bariatric psychologist, who focuses on emotional eating and creating positive behavorial change.  Was counseled on bariatric pharmacotherapy and how this may be used as an adjunct in their weight management   Mia Norris appears to be in the action stage of change and states they are ready to start  intensive lifestyle modifications and behavioral modifications.  It was recommended that she follow up in the next 1-2 weeks to review the above steps, and to continue with treatment of their chronic disease state of obesity   FOR OTHER CONDITIONS RELATED TO THE DISEASE OF OBESITY:   Hyperlipidemia, unspecified hyperlipidemia type Assessment & Plan: Onset: 2-3 years ago. She is on Lipitor 40 mg daily without any adverse SE. Continue statin therapy. She would benefit from a prudent nutritional plan that is low in saturated and trans fats, and low in fatty carbs. Recheck lipid panel next OV.    HTN, goal to be determined Assessment & Plan: Last 3 blood pressure readings in our office are as follows: BP Readings from Last 3 Encounters:  04/13/23 105/69  04/10/23 112/74  04/05/23 122/86   Onset: 3-4 years ago. HTN treated w/ Hydrodiuril 25 mg daily & Cozaar 50 mg daily.  Pt wishes to go off medications at some point. BP is at goal today. Continue adherence to antihypertensive therapy. She would benefit from a low sodium diet. We will check renal panel and assess cardiovascular risk at the next OV.   Attestations:   Reviewed by clinician on day of visit: allergies, medications, problem list, medical history, surgical history, family history, social history, and previous encounter notes pertinent to obesity  diagnosis.  52 minutes was spent today on this visit including the above counseling, pre-visit chart review, and post-visit documentation.  Over 50% of this time was spent in direct, face-to-face counseling and coordination of care  I, Special Randolm Idol, acting as a medical scribe for Thomasene Lot, DO., have compiled all relevant documentation for today's office visit on behalf of Thomasene Lot, DO, while in the presence of [employer], DO.  I have reviewed the above documentation for accuracy and completeness, and I agree with the above. Mia Norris, D.O.  The 21st Century  Cures Act was signed into law in 2016 which includes the topic of electronic health records.  This provides immediate access to information in MyChart.  This includes consultation notes, operative notes, office notes, lab results and pathology reports.  If you have any questions about what you read please let us know at your next visit so we can discuss your concerns and take corrective action if need be.  We are right here with you!

## 2023-04-14 ENCOUNTER — Other Ambulatory Visit (HOSPITAL_COMMUNITY)
Admission: RE | Admit: 2023-04-14 | Discharge: 2023-04-14 | Disposition: A | Discharge status: Home / Self Care | Source: Ambulatory Visit | Attending: Gastroenterology | Admitting: Gastroenterology

## 2023-04-14 ENCOUNTER — Other Ambulatory Visit: Payer: Self-pay | Admitting: Gastroenterology

## 2023-04-14 DIAGNOSIS — R1011 Right upper quadrant pain: Secondary | ICD-10-CM | POA: Insufficient documentation

## 2023-04-14 DIAGNOSIS — E876 Hypokalemia: Secondary | ICD-10-CM

## 2023-04-14 DIAGNOSIS — R110 Nausea: Secondary | ICD-10-CM | POA: Diagnosis not present

## 2023-04-14 LAB — BASIC METABOLIC PANEL
Anion gap: 8 (ref 5–15)
BUN: 10 mg/dL (ref 6–20)
CO2: 29 mmol/L (ref 22–32)
Calcium: 9.4 mg/dL (ref 8.9–10.3)
Chloride: 104 mmol/L (ref 98–111)
Creatinine, Ser: 0.77 mg/dL (ref 0.44–1.00)
GFR, Estimated: >60 mL/min (ref >60)
Glucose, Bld: 73 mg/dL (ref 70–99)
Potassium: 3.2 mmol/L — ABNORMAL LOW (ref 3.5–5.1)
Sodium: 141 mmol/L (ref 135–145)

## 2023-04-14 LAB — PREGNANCY, URINE: Preg Test, Ur: NEGATIVE

## 2023-04-14 MED ORDER — POTASSIUM CHLORIDE CRYS ER 20 MEQ PO TBCR: 20.0000 meq | EXTENDED_RELEASE_TABLET | Freq: Every day | ORAL | 0 refills | Status: DC

## 2023-04-19 ENCOUNTER — Encounter (HOSPITAL_COMMUNITY): Payer: Self-pay | Admitting: Gastroenterology

## 2023-04-19 ENCOUNTER — Ambulatory Visit (HOSPITAL_COMMUNITY): Admitting: Anesthesiology

## 2023-04-19 ENCOUNTER — Other Ambulatory Visit: Payer: Self-pay

## 2023-04-19 ENCOUNTER — Encounter (HOSPITAL_COMMUNITY)
Admission: RE | Disposition: A | Discharge status: Home / Self Care | Payer: Self-pay | Source: Home / Self Care | Attending: Gastroenterology

## 2023-04-19 ENCOUNTER — Ambulatory Visit (HOSPITAL_COMMUNITY)
Admission: RE | Admit: 2023-04-19 | Discharge: 2023-04-19 | Disposition: A | Discharge status: Home / Self Care | Attending: Gastroenterology | Admitting: Gastroenterology

## 2023-04-19 DIAGNOSIS — K449 Diaphragmatic hernia without obstruction or gangrene: Secondary | ICD-10-CM | POA: Insufficient documentation

## 2023-04-19 DIAGNOSIS — E785 Hyperlipidemia, unspecified: Secondary | ICD-10-CM | POA: Diagnosis not present

## 2023-04-19 DIAGNOSIS — K319 Disease of stomach and duodenum, unspecified: Secondary | ICD-10-CM | POA: Insufficient documentation

## 2023-04-19 DIAGNOSIS — K3189 Other diseases of stomach and duodenum: Secondary | ICD-10-CM | POA: Diagnosis not present

## 2023-04-19 DIAGNOSIS — R110 Nausea: Secondary | ICD-10-CM | POA: Diagnosis not present

## 2023-04-19 DIAGNOSIS — I10 Essential (primary) hypertension: Secondary | ICD-10-CM | POA: Insufficient documentation

## 2023-04-19 DIAGNOSIS — R1011 Right upper quadrant pain: Secondary | ICD-10-CM | POA: Diagnosis not present

## 2023-04-19 DIAGNOSIS — Z87891 Personal history of nicotine dependence: Secondary | ICD-10-CM | POA: Diagnosis not present

## 2023-04-19 DIAGNOSIS — K7689 Other specified diseases of liver: Secondary | ICD-10-CM | POA: Insufficient documentation

## 2023-04-19 HISTORY — PX: ESOPHAGOGASTRODUODENOSCOPY (EGD) WITH PROPOFOL: SHX5813

## 2023-04-19 SURGERY — ESOPHAGOGASTRODUODENOSCOPY (EGD) WITH PROPOFOL
Anesthesia: General

## 2023-04-19 MED ORDER — PROPOFOL 500 MG/50ML IV EMUL
INTRAVENOUS | Status: DC | PRN
  Administered 2023-04-19: 150 ug/kg/min via INTRAVENOUS

## 2023-04-19 MED ORDER — SIMETHICONE 40 MG/0.6ML PO SUSP
ORAL | Status: DC | PRN
  Administered 2023-04-19: 60 mL

## 2023-04-19 MED ORDER — LACTATED RINGERS IV SOLN: INTRAVENOUS | Status: DC

## 2023-04-19 MED ORDER — PROPOFOL 10 MG/ML IV BOLUS
INTRAVENOUS | Status: DC | PRN
  Administered 2023-04-19: 50 mg via INTRAVENOUS
  Administered 2023-04-19: 100 mg via INTRAVENOUS

## 2023-04-19 MED ORDER — LIDOCAINE HCL (PF) 2 % IJ SOLN
INTRAMUSCULAR | Status: DC | PRN
  Administered 2023-04-19: 100 mg via INTRADERMAL

## 2023-04-19 NOTE — Op Note (Signed)
 [facility] Patient Name: Mia Norris Procedure Date: 04/19/2023 10:06 AM MRN: [id number] Date of Birth: [birth year] Attending MD: Katrinka Blazing , , [id number] CSN: [id number] Age: 43 Admit Type: Outpatient Procedure:                Upper GI endoscopy Indications:              Abdominal pain in the right upper quadrant Providers:                Katrinka Blazing, Angelica Ran, Cyril Mourning,                            Technician Referring MD:              Medicines:                Monitored Anesthesia Care Complications:            No immediate complications. Estimated Blood Loss:     Estimated blood loss: none. Procedure:                Pre-Anesthesia Assessment:                           - Prior to the procedure, a History and Physical                            was performed, and patient medications, allergies                            and sensitivities were reviewed. The patient's                            tolerance of previous anesthesia was reviewed.                           - The risks and benefits of the procedure and the                            sedation options and risks were discussed with the                            patient. All questions were answered and informed                            consent was obtained.                           - ASA Grade Assessment: I - A normal, healthy                            patient.                           After obtaining informed consent, the endoscope was                            passed under direct vision. Throughout the  procedure, the patient's blood pressure, pulse, and                            oxygen saturations were monitored continuously. The                            [id number]-[id number] ([id number]) scope was introduced through the                            mouth, and advanced to the second part of duodenum.                            The upper GI endoscopy was accomplished without                             difficulty. The patient tolerated the procedure                            well. Scope In: 10:27:20 AM Scope Out: 10:34:33 AM Total Procedure Duration: 0 hours 7 minutes 13 seconds  Findings:      A 1 cm hiatal hernia was present.      The gastroesophageal flap valve was visualized endoscopically and       classified as Hill Grade II (fold present, opens with respiration).      The entire examined stomach was normal. Biopsies were taken with a cold       forceps for Helicobacter pylori testing.      The examined duodenum was normal. Biopsies were taken with a cold       forceps for histology. Impression:               - 1 cm hiatal hernia.                           - Normal stomach. Biopsied.                           - Normal examined duodenum. Biopsied. Moderate Sedation:      Per Anesthesia Care Recommendation:           - Discharge patient to home (ambulatory).                           - Resume previous diet.                           - Await pathology results.                           - If normal biopsies, will refer for evaluation by                            hepatobiliary surgeon at [facility]. Procedure Code(s):        --- Professional ---                           [phone], Esophagogastroduodenoscopy, flexible,  transoral; with biopsy, single or multiple Diagnosis Code(s):        --- Professional ---                           K44.9, Diaphragmatic hernia without obstruction or                            gangrene                           R10.11, Right upper quadrant pain CPT copyright 2022 American Medical Association. All rights reserved. The codes documented in this report are preliminary and upon coder review may  be revised to meet current compliance requirements. Katrinka Blazing, MD Katrinka Blazing,  04/19/2023 10:38:00 AM This report has been signed electronically. Number of Addenda: 0

## 2023-04-19 NOTE — Discharge Instructions (Signed)
 You are being discharged to home.  Resume your previous diet.  We are waiting for your pathology results.  If normal biopsies, will refer for evaluation by hepatobiliary surgeon at [facility].

## 2023-04-19 NOTE — Interval H&P Note (Signed)
 History and Physical Interval Note:  04/19/2023 10:01 AM  Mia Norris  has presented today for surgery, with the diagnosis of RUQ PAIN, NAUSEA.  The various methods of treatment have been discussed with the patient and family. After consideration of risks, benefits and other options for treatment, the patient has consented to  Procedure(s) with comments: ESOPHAGOGASTRODUODENOSCOPY (EGD) WITH PROPOFOL (N/A) - 10:45AM;ASA 2 as a surgical intervention.  The patient's history has been reviewed, patient examined, no change in status, stable for surgery.  I have reviewed the patient's chart and labs.  Questions were answered to the patient's satisfaction.     Katrinka Blazing [place]a

## 2023-04-19 NOTE — Anesthesia Preprocedure Evaluation (Signed)
 Anesthesia Evaluation  Patient identified by MRN, date of birth, ID band Patient awake    Reviewed: Allergy & Precautions, H&P , NPO status , Patient's Chart, lab work & pertinent test results, reviewed documented beta blocker date and time   Airway Mallampati: II  TM Distance: >3 FB Neck ROM: full    Dental no notable dental hx.    Pulmonary neg pulmonary ROS, former smoker   Pulmonary exam normal breath sounds clear to auscultation       Cardiovascular Exercise Tolerance: Good hypertension, negative cardio ROS  Rhythm:regular Rate:Normal     Neuro/Psych  Headaches negative neurological ROS  negative psych ROS   GI/Hepatic negative GI ROS, Neg liver ROS,,,  Endo/Other  negative endocrine ROS    Renal/GU negative Renal ROS  negative genitourinary   Musculoskeletal   Abdominal   Peds  Hematology negative hematology ROS (+)   Anesthesia Other Findings   Reproductive/Obstetrics negative OB ROS                             Anesthesia Physical Anesthesia Plan  ASA: 2  Anesthesia Plan: General   Post-op Pain Management:    Induction:   PONV Risk Score and Plan: Propofol infusion  Airway Management Planned:   Additional Equipment:   Intra-op Plan:   Post-operative Plan:   Informed Consent: I have reviewed the patients History and Physical, chart, labs and discussed the procedure including the risks, benefits and alternatives for the proposed anesthesia with the patient or authorized representative who has indicated his/her understanding and acceptance.     Dental Advisory Given  Plan Discussed with: CRNA  Anesthesia Plan Comments:        Anesthesia Quick Evaluation

## 2023-04-19 NOTE — Anesthesia Procedure Notes (Signed)
 Date/Time: 04/19/2023 10:23 AM  Performed by: Julian Reil, CRNAPre-anesthesia Checklist: Patient identified, Emergency Drugs available, Suction available and Patient being monitored Patient Re-evaluated:Patient Re-evaluated prior to induction Oxygen Delivery Method: Nasal cannula Induction Type: IV induction Placement Confirmation: positive ETCO2 Comments: Optiflow High Flow

## 2023-04-19 NOTE — Transfer of Care (Signed)
 Immediate Anesthesia Transfer of Care Note  Patient: Mia Norris  Procedure(s) Performed: ESOPHAGOGASTRODUODENOSCOPY (EGD) WITH PROPOFOL  Patient Location: Endoscopy Unit  Anesthesia Type:General  Level of Consciousness: drowsy  Airway & Oxygen Therapy: Patient Spontanous Breathing  Post-op Assessment: Report given to RN and Post -op Vital signs reviewed and stable  Post vital signs: Reviewed and stable  Last Vitals:  Vitals Value Taken Time  BP    Temp    Pulse    Resp    SpO2      Last Pain:  Vitals:   04/19/23 1023  TempSrc:   PainSc: 7       Patients Stated Pain Goal: 9 (04/19/23 0926)  Complications: No notable events documented.

## 2023-04-20 ENCOUNTER — Encounter (HOSPITAL_COMMUNITY): Payer: Self-pay | Admitting: Gastroenterology

## 2023-04-21 LAB — SURGICAL PATHOLOGY

## 2023-04-21 NOTE — Anesthesia Postprocedure Evaluation (Signed)
 Anesthesia Post Note  Patient: Mia Norris  Procedure(s) Performed: ESOPHAGOGASTRODUODENOSCOPY (EGD) WITH PROPOFOL  Patient location during evaluation: Phase II Anesthesia Type: General Level of consciousness: awake Pain management: pain level controlled Vital Signs Assessment: post-procedure vital signs reviewed and stable Respiratory status: spontaneous breathing and respiratory function stable Cardiovascular status: blood pressure returned to baseline and stable Postop Assessment: no headache and no apparent nausea or vomiting Anesthetic complications: no Comments: Late entry   No notable events documented.   Last Vitals:  Vitals:   04/19/23 0926 04/19/23 1038  BP: 138/86 117/71  Pulse: 74 76  Resp:  19  Temp: 37 C 36.6 C  SpO2: 99% 100%    Last Pain:  Vitals:   04/19/23 1040  TempSrc:   PainSc: 8                  Windell Norfolk

## 2023-04-26 ENCOUNTER — Encounter (INDEPENDENT_AMBULATORY_CARE_PROVIDER_SITE_OTHER): Payer: Self-pay | Admitting: *Deleted

## 2023-05-18 ENCOUNTER — Ambulatory Visit (INDEPENDENT_AMBULATORY_CARE_PROVIDER_SITE_OTHER): Payer: BC Managed Care – PPO | Admitting: Gastroenterology

## 2023-05-23 DIAGNOSIS — K7689 Other specified diseases of liver: Secondary | ICD-10-CM | POA: Diagnosis not present

## 2023-05-24 DIAGNOSIS — Z0289 Encounter for other administrative examinations: Secondary | ICD-10-CM

## 2023-06-05 DIAGNOSIS — Z0289 Encounter for other administrative examinations: Secondary | ICD-10-CM

## 2023-07-11 ENCOUNTER — Ambulatory Visit (INDEPENDENT_AMBULATORY_CARE_PROVIDER_SITE_OTHER): Admitting: Gastroenterology

## 2023-07-17 ENCOUNTER — Ambulatory Visit (INDEPENDENT_AMBULATORY_CARE_PROVIDER_SITE_OTHER): Admitting: Gastroenterology

## 2023-07-17 ENCOUNTER — Encounter (INDEPENDENT_AMBULATORY_CARE_PROVIDER_SITE_OTHER): Payer: Self-pay | Admitting: Gastroenterology

## 2023-07-17 VITALS — BP 100/67 | HR 85 | Temp 98.1°F | Ht 66.0 in | Wt 260.2 lb

## 2023-07-17 DIAGNOSIS — K7689 Other specified diseases of liver: Secondary | ICD-10-CM

## 2023-07-17 DIAGNOSIS — R1011 Right upper quadrant pain: Secondary | ICD-10-CM

## 2023-07-17 MED ORDER — NAPROXEN 500 MG PO TABS: 500.0000 mg | ORAL_TABLET | Freq: Two times a day (BID) | ORAL | 0 refills | Status: AC

## 2023-07-17 NOTE — Patient Instructions (Signed)
 We will try naprosyn 500mg  twice daily to see if this helps with your pain, make sure to take this with food As discussed, I feel your pain may be related to your back issues. You may consider massage or chiropractic therapy as well Keep up the great work with exercise as you are doing!  Follow up 4 months  It was a pleasure to see you today. I want to create trusting relationships with patients and provide genuine, compassionate, and quality care. I truly value your feedback! please be on the lookout for a survey regarding your visit with me today. I appreciate your input about our visit and your time in completing this!    Mia Norris L. Shreena Baines, MSN, APRN, AGNP-C Adult-Gerontology Nurse Practitioner [facility] at [facility]

## 2023-07-17 NOTE — Progress Notes (Unsigned)
 Referring Provider: Health, [facility]be* Primary Care Physician:  Health, [facility] Primary GI Physician: Dr. Sammi Crick   Chief Complaint  Patient presents with   Follow-up    Pt arrives for follow up. Pt did see [facility] in April. Pt still having sharp pain on right side and lower back pain. Pt will begin with [facility] this month.    HPI:   Mia Norris is a 43 y.o. female with past medical history of chronic headaches, HLD, HTN   Patient presenting today for:  Follow up of RUQ pain, nausea, liver cyst  Last seen march 2025, at that time RUQ pain x2 months, radiating into her back. Having a BM BID  Recommended EGD, tylenol , consider referral to [facility] for possible partial liver resection/cyst removal if EGD unremarkable  EGD as below, patient was referred to [facility] for further evaluation of hepatic cysts  Had consult with Dr. Yehuda Helms at [facility] on 05/23/23, though it was discussed pain was likely not related to the cyst and due to potential risks, recommended against surgical intervention. 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 Still having some RUQ pain but she knows that a lot of times she thinks about it and then it occurs. Having pain maybe twice per week. She has some lower back pain at times as well. She was going to [facility] to get steroid injections to help with her back pain but she stopped as she was concerned this could  She is going to the gym 30-45 minutes 5 days per week and starting with healthy weight and wellness later this month to try and lose some weight. She notes she will lose weight and eat healthy and then will see her weight back. She is not taking anything for her pain currently. Unsure if movement changes her RUQ pain. Has some occasional constipation. No rectal bleeding, melena, GERD  symptoms, nausea or vomiting. Pain is not associated with eating. Has not tried heat or ice on her back but stretching does seem to help with her RUQ Pain. Has taken ibuprofen in the past but did not want to take this ongoing   Last Endoscopy: 04/2023  - 1 cm hiatal hernia.  - Normal stomach. Biopsied.  Normal examined duodenum. Biopsied. A. SMALL BOWEL, BIOPSY:  Duodenal mucosa with normal villous architecture.  No villous atrophy or increased intraepithelial lymphocytes.  B. STOMACH, BIOPSY:  Gastric antral and oxyntic mucosa with reactive gastropathy.  Negative for Helicobacter pylori.   CT A/P WO contrast: 04/03/23 No acute findings within the abdomen or pelvis. 2. Multiple scattered liver cysts. The largest cyst is partially exophytic arising off of segment 3 of the lateral segment of left lobe measuring 12.0 x 8.0 cm. 3. Small exophytic fibroid off the left side of uterine fundus measures 1.3 cm. 4. Osteitis pubis. 5.  Aortic Atherosclerosis  Last Colonoscopy: never    Past Medical History:  Diagnosis Date   Chronic headaches    Hyperlipidemia    Hypertension    Obesity     Past Surgical History:  Procedure Laterality Date   CESAREAN SECTION     ESOPHAGOGASTRODUODENOSCOPY (EGD) WITH PROPOFOL  N/A 04/19/2023   Procedure: ESOPHAGOGASTRODUODENOSCOPY (EGD) WITH PROPOFOL ;  Surgeon: Urban Garden, MD;  Location: [facility];  Service: Gastroenterology;  Laterality: N/A;  10:45AM;ASA 2    Current Outpatient Medications  Medication  Sig Dispense Refill   atorvastatin (LIPITOR) 40 MG tablet Take 40 mg by mouth at bedtime.     hydrochlorothiazide  (HYDRODIURIL ) 25 MG tablet Take 25 mg by mouth daily.     losartan (COZAAR) 50 MG tablet Take 50 mg by mouth daily.     Multiple Vitamins-Minerals (ONE-A-DAY WOMENS VITACRAVES) CHEW Chew 1 each by mouth daily.     OVER THE COUNTER MEDICATION Vit b12  Elderberry     oxyCODONE  (ROXICODONE ) 5 MG immediate release tablet Take  1 tablet (5 mg total) by mouth every 4 (four) hours as needed for severe pain (pain score 7-10). 12 tablet 0   potassium chloride  SA (KLOR-CON  M) 20 MEQ tablet Take 1 tablet (20 mEq total) by mouth daily. 15 tablet 0   No current facility-administered medications for this visit.    Allergies as of 07/17/2023 - Review Complete 07/17/2023  Allergen Reaction Noted   Penicillins  06/09/2015    Social History   Socioeconomic History   Marital status: Single    Spouse name: Not on file   Number of children: Not on file   Years of education: Not on file   Highest education level: Not on file  Occupational History   Not on file  Tobacco Use   Smoking status: Former    Types: Cigarettes    Passive exposure: Current   Smokeless tobacco: Never  Vaping Use   Vaping status: Some Days  Substance and Sexual Activity   Alcohol use: Not Currently    Alcohol/week: 1.0 standard drink of alcohol    Types: 1 Glasses of wine per week   Drug use: Yes    Frequency: 7.0 times per week    Types: Marijuana    Comment: last smoked 24 hours ago   Sexual activity: Not on file  Other Topics Concern   Not on file  Social History Narrative   Not on file   Social Drivers of Health   Financial Resource Strain: Not on file  Food Insecurity: No Food Insecurity (11/19/2021)   Received [facility], [facility]   Hunger Vital Sign    Worried About Running Out of Food in the Last Year: Never true    Ran Out of Food in the Last Year: Never true  Transportation Needs: Not on file  Physical Activity: Not on file  Stress: Not on file  Social Connections: Unknown (07/13/2021)   Received from [facility], [facility]   Social Network    Social Network: Not on file    Review of systems General: negative for malaise, night sweats, fever, chills, weight los Neck: Negative for lumps, goiter, pain and significant neck swelling Resp: Negative for cough, wheezing, dyspnea at rest CV: Negative for  chest pain, leg swelling, palpitations, orthopnea GI: denies melena, hematochezia, nausea, vomiting, diarrhea, constipation, dysphagia, odyonophagia, early satiety or unintentional weight loss.  MSK: Negative for joint pain or swelling, back pain, and muscle pain. Derm: Negative for itching or rash Psych: Denies depression, anxiety, memory loss, confusion. No homicidal or suicidal ideation.  Heme: Negative for prolonged bleeding, bruising easily, and swollen nodes. Endocrine: Negative for cold or heat intolerance, polyuria, polydipsia and goiter. Neuro: negative for tremor, gait imbalance, syncope and seizures. The remainder of the review of systems is noncontributory.  Physical Exam: There were no vitals taken for this visit. General:   Alert and oriented. No distress noted. Pleasant and cooperative.  Head:  Normocephalic and atraumatic. Eyes:  Conjuctiva clear without scleral  icterus. Mouth:  Oral mucosa pink and moist. Good dentition. No lesions. Heart: Normal rate and rhythm, s1 and s2 heart sounds present.  Lungs: Clear lung sounds in all lobes. Respirations equal and unlabored. Abdomen:  +BS, soft, non-tender and non-distended. No rebound or guarding. No HSM or masses noted. Derm: No palmar erythema or jaundice Msk:  Symmetrical without gross deformities. Normal posture. Extremities:  Without edema. Neurologic:  Alert and  oriented x4 Psych:  Alert and cooperative. Normal mood and affect.  Invalid input(s): "6 MONTHS"   ASSESSMENT: Mia Norris is a 43 y.o. female presenting today    PLAN:  -alternate heat/ice for pain -7 day course naprosyn 500mg  BID with food  - consider massage/chiropractic therapy -continue with good weight management/routine exercise   All questions were answered, patient verbalized understanding and is in agreement with plan as outlined above.   Follow Up: 4 months   Mia Norris L. Brae Schaafsma, MSN, APRN, AGNP-C Adult-Gerontology Nurse  Practitioner [facility]

## 2023-07-26 ENCOUNTER — Encounter (INDEPENDENT_AMBULATORY_CARE_PROVIDER_SITE_OTHER): Payer: Self-pay

## 2023-08-01 ENCOUNTER — Encounter (INDEPENDENT_AMBULATORY_CARE_PROVIDER_SITE_OTHER): Payer: Self-pay | Admitting: Family Medicine

## 2023-08-01 ENCOUNTER — Ambulatory Visit (INDEPENDENT_AMBULATORY_CARE_PROVIDER_SITE_OTHER): Admitting: Family Medicine

## 2023-08-01 VITALS — BP 148/92 | HR 72 | Temp 98.3°F | Ht 66.0 in | Wt 258.8 lb

## 2023-08-01 DIAGNOSIS — R0602 Shortness of breath: Secondary | ICD-10-CM | POA: Diagnosis not present

## 2023-08-01 DIAGNOSIS — M545 Low back pain, unspecified: Secondary | ICD-10-CM

## 2023-08-01 DIAGNOSIS — R5383 Other fatigue: Secondary | ICD-10-CM | POA: Insufficient documentation

## 2023-08-01 DIAGNOSIS — R739 Hyperglycemia, unspecified: Secondary | ICD-10-CM | POA: Diagnosis not present

## 2023-08-01 DIAGNOSIS — E785 Hyperlipidemia, unspecified: Secondary | ICD-10-CM

## 2023-08-01 DIAGNOSIS — Z1331 Encounter for screening for depression: Secondary | ICD-10-CM | POA: Insufficient documentation

## 2023-08-01 DIAGNOSIS — I10 Essential (primary) hypertension: Secondary | ICD-10-CM | POA: Diagnosis not present

## 2023-08-01 DIAGNOSIS — R7303 Prediabetes: Secondary | ICD-10-CM | POA: Diagnosis not present

## 2023-08-01 DIAGNOSIS — E669 Obesity, unspecified: Secondary | ICD-10-CM | POA: Insufficient documentation

## 2023-08-01 DIAGNOSIS — E538 Deficiency of other specified B group vitamins: Secondary | ICD-10-CM | POA: Diagnosis not present

## 2023-08-01 DIAGNOSIS — R062 Wheezing: Secondary | ICD-10-CM | POA: Diagnosis not present

## 2023-08-01 DIAGNOSIS — Z6841 Body Mass Index (BMI) 40.0 and over, adult: Secondary | ICD-10-CM

## 2023-08-01 DIAGNOSIS — G8929 Other chronic pain: Secondary | ICD-10-CM | POA: Insufficient documentation

## 2023-08-01 NOTE — Progress Notes (Signed)
 Office: [phone]  /  Fax: [phone]  WEIGHT SUMMARY AND BIOMETRICS  Anthropometric Measurements Height: 5' 6 (1.676 m) Weight: 258 lb 12.8 oz (117.4 kg) BMI (Calculated): 41.79 Weight at Last Visit: N/A Weight Lost Since Last Visit: N/A Weight Gained Since Last Visit: N/A Starting Weight: 258.8 lb Peak Weight: 309 lb Waist Measurement : 53 inches   Body Composition  Body Fat %: 45.9 % Fat Mass (lbs): 118.8 lbs Muscle Mass (lbs): 133 lbs Total Body Water  (lbs): 88.6 lbs Visceral Fat Rating : 13   Other Clinical Data Fasting: Yes Labs: Yes Today's Visit #: 1 Starting Date: 08/01/23    Chief Complaint: OBESITY    History of Present Illness Mia Norris is a 43 year old female who presents for a workup to determine the best treatment plan for obesity.  She is concerned about her weight, describing it as a significant problem. She experiences obesity-related conditions including hypertension, hyperlipidemia, fatigue, and shortness of breath, all of which worsen with weight gain. She is currently taking losartan, hydrochlorothiazide , and potassium for her blood pressure, and Lipitor for her cholesterol.  She has a history of hyperglycemia with an elevated glucose level of 113 earlier this year. Additionally, she has a history of B12 deficiency and is taking over-the-counter B12 supplements.  She mentions experiencing lower back pain, which she attributes to her weight. She has previously received cortisone shots for her back pain but decided to stop them, believing that losing weight would alleviate her back pain.  She discusses her dietary preferences, noting a dislike for milk, yogurt, and cottage cheese, preferring fat-free milk due to its texture.  She has filled out her new patient obesity and comorbidities questionnaire. Pertinent positives are as follows: She has a history of hypertension and hyperlipidemia. She reports worsening fatigue associated with  weight gain and shortness of breath during exercise. Additionally, she experiences worsening back pain. All these conditions will be addressed through a treatment plan focusing on diet, exercise, and weight loss to improve her overall health.  She has not had weight loss surgery but has considered it; however, she prefers to work on medical weight loss before resorting to surgical options. She lives with her grown son and her boyfriend, who she feels will be supportive of her weight loss efforts. Currently, she exercises by walking and going to the gym 3 to 5 times a week for 30 to 40 minutes each session.  Her goal weight is 190 pounds, while her current weight is 258 pounds, with a height of 5 feet 6 inches. She believes she will be healthy at her goal weight and aims to reach it within 6 to 8 months. She feels her weight gain is due to overeating, with her heaviest weight recorded at 309 pounds. Recently, she has lost 15 pounds but has struggled to maintain that weight loss. This was achieved through increased walking and healthier eating.  She eats outside the home 2 times a week and does most of the grocery shopping every 1 to 2 weeks. She enjoys cooking and notes no obstacles to preparing meals. She craves sweets, with cravings occurring at any time. She dislikes pork and sodas and sometimes considers herself a picky eater. Generally, she does not snack between meals, although she does snack more frequently at night, primarily on cookies. She often skips breakfast and does not follow a vegan or vegetarian lifestyle. She identifies her worst food habits as excessive sugar intake and sometimes struggles with poor food  choices. However, she does not feel that she experiences excessive hunger or challenges with portion control, as she typically does not eat until uncomfortably full. She tends to snack more when stressed and occasionally felt judged about her weight or eating habits growing up. She does not  feel out of control with her eating behavior, tends to eat slowly, and consumes larger portions than the average person 2 to 3 times per week. She has not been diagnosed with any eating disorder.  A modified Mood and Food 9 score was positive at 11, and the Epworth Sleepiness Score was negative at 3. An EKG today showed sinus bradycardia with a ventricular rate of 52. The calculated basal metabolic rate is 8018, and the measured resting energy expenditure was slightly low at 1872.      PHYSICAL EXAM:  Blood pressure (!) 148/92, pulse 72, temperature 98.3 F (36.8 C), height 5' 6 (1.676 m), weight 258 lb 12.8 oz (117.4 kg), SpO2 100%. Body mass index is 41.77 kg/m.  DIAGNOSTIC DATA REVIEWED:  BMET    Component Value Date/Time   NA 141 04/14/2023 1605   K 3.2 (L) 04/14/2023 1605   CL 104 04/14/2023 1605   CO2 29 04/14/2023 1605   GLUCOSE 73 04/14/2023 1605   BUN 10 04/14/2023 1605   CREATININE 0.77 04/14/2023 1605   CALCIUM 9.4 04/14/2023 1605   GFRNONAA >60 04/14/2023 1605   GFRAA >60 09/08/2019 2320   No results found for: HGBA1C No results found for: INSULIN No results found for: TSH CBC    Component Value Date/Time   WBC 8.6 04/03/2023 0745   RBC 4.59 04/03/2023 0745   HGB 12.6 04/03/2023 0745   HCT 39.8 04/03/2023 0745   PLT 251 04/03/2023 0745   MCV 86.7 04/03/2023 0745   MCH 27.5 04/03/2023 0745   MCHC 31.7 04/03/2023 0745   RDW 13.7 04/03/2023 0745   Iron Studies No results found for: IRON, TIBC, FERRITIN, IRONPCTSAT Lipid Panel  No results found for: CHOL, TRIG, HDL, CHOLHDL, VLDL, LDLCALC, LDLDIRECT Hepatic Function Panel     Component Value Date/Time   PROT 6.7 04/03/2023 0745   ALBUMIN 3.8 04/03/2023 0745   AST 20 04/03/2023 0745   ALT 17 04/03/2023 0745   ALKPHOS 54 04/03/2023 0745   BILITOT 1.9 (H) 04/03/2023 0745   BILIDIR 0.1 09/08/2019 2320   IBILI 0.7 09/08/2019 2320   No results found for:  TSH Nutritional No results found for: VD25OH   Assessment and Plan Assessment & Plan Obesity Obesity is addressed as a chronic disease requiring a comprehensive treatment plan. She is concerned about her weight's impact on her health, including fatigue, dyspnea, and back pain. Weight loss can alleviate some symptoms, though it may not resolve underlying issues like arthritis. The treatment plan involves dietary modifications, with food considered as medicine, tailored to her preferences and lifestyle for a healthier weight and improved well-being. - Initiate a specific eating plan (category three) with a grocery list provided. - Instruct her to follow the eating plan closely for two weeks and report back on challenges and progress. - Order lab tests to further assess her health status. - Schedule follow-up appointment in two weeks to review progress and lab results.  Hypertension Blood pressure readings were elevated at 140/82 and 140/92. She is on losartan, hydrochlorothiazide , and potassium for management. Pain, such as back pain, could contribute to elevated blood pressure. The goal is to manage hypertension as part of the overall health improvement plan. -  Continue losartan, hydrochlorothiazide , and potassium. - Monitor blood pressure and assess for changes in future visits.  Hyperlipidemia She is on Lipitor for cholesterol management. Hyperlipidemia is monitored as part of obesity-related conditions. The dietary plan may contribute to improved lipid levels. - Continue Lipitor for cholesterol management. - Evaluate lipid levels with upcoming lab tests.  Hyperglycemia She has hyperglycemia with a glucose level of 113 earlier this year. This condition is monitored as part of obesity-related health concerns. Dietary changes may help manage blood glucose levels.  Back Pain She reports lower back pain attributed to her weight. She previously received cortisone shots but discontinued  them. Weight loss could alleviate some pressure on the back, though it may not resolve underlying issues such as arthritis. A referral to sports medicine may be considered if needed. - Monitor back pain and consider referral to sports medicine if symptoms persist or worsen.  B12 Deficiency She is taking over-the-counter B12 supplements. This condition is monitored to ensure adequate B12 levels are maintained. - Continue over-the-counter B12 supplements. - Include B12 level assessment in upcoming lab tests.  General Health Maintenance Emphasized the importance of not weighing herself between visits to avoid stress and potential stress eating. She is advised to focus on the eating plan and not worry about the number on the scale. All test results will be reviewed, and guidance on vitamin supplementation will be provided after lab results are available. - Advise against weighing herself between visits. - Review lab results and provide guidance on vitamin supplementation if needed.  Follow-up She is scheduled for follow-up appointments to monitor progress with the eating plan and review lab results. The treatment plan will be adjusted based on her feedback and test outcomes. - Schedule follow-up appointment in two weeks to review progress and lab results. - Set another appointment two weeks after the next visit.     I have personally spent 47 minutes total time today in preparation, patient care, and documentation for this visit, including the following: review of clinical lab tests; review of medical history, review of weight history, review of nutritional history, review of obesity treatments in the past and nutritional counseling.   She was informed of the importance of frequent follow up visits to maximize her success with intensive lifestyle modifications for her multiple health conditions.    Louann Penton, MD

## 2023-08-02 ENCOUNTER — Encounter (INDEPENDENT_AMBULATORY_CARE_PROVIDER_SITE_OTHER): Payer: Self-pay | Admitting: Family Medicine

## 2023-08-02 LAB — CBC WITH DIFFERENTIAL/PLATELET
Basophils Absolute: 0.1 10*3/uL (ref 0.0–0.2)
Basos: 1 %
EOS (ABSOLUTE): 0.2 10*3/uL (ref 0.0–0.4)
Eos: 3 %
Hematocrit: 45.3 % (ref 34.0–46.6)
Hemoglobin: 13.8 g/dL (ref 11.1–15.9)
Immature Grans (Abs): 0 10*3/uL (ref 0.0–0.1)
Immature Granulocytes: 0 %
Lymphocytes Absolute: 3.2 10*3/uL — ABNORMAL HIGH (ref 0.7–3.1)
Lymphs: 37 %
MCH: 26.5 pg — ABNORMAL LOW (ref 26.6–33.0)
MCHC: 30.5 g/dL — ABNORMAL LOW (ref 31.5–35.7)
MCV: 87 fL (ref 79–97)
Monocytes Absolute: 0.7 10*3/uL (ref 0.1–0.9)
Monocytes: 8 %
Neutrophils Absolute: 4.6 10*3/uL (ref 1.4–7.0)
Neutrophils: 51 %
Platelets: 289 10*3/uL (ref 150–450)
RBC: 5.21 x10E6/uL (ref 3.77–5.28)
RDW: 12.8 % (ref 11.7–15.4)
WBC: 8.9 10*3/uL (ref 3.4–10.8)

## 2023-08-02 LAB — LIPID PANEL WITH LDL/HDL RATIO
Cholesterol, Total: 178 mg/dL (ref 100–199)
HDL: 59 mg/dL (ref >39)
LDL Chol Calc (NIH): 100 mg/dL — ABNORMAL HIGH (ref 0–99)
LDL/HDL Ratio: 1.7 ratio (ref 0.0–3.2)
Triglycerides: 107 mg/dL (ref 0–149)
VLDL Cholesterol Cal: 19 mg/dL (ref 5–40)

## 2023-08-02 LAB — CMP14+EGFR
ALT: 16 IU/L (ref 0–32)
AST: 23 IU/L (ref 0–40)
Albumin: 4.5 g/dL (ref 3.9–4.9)
Alkaline Phosphatase: 90 IU/L (ref 44–121)
BUN/Creatinine Ratio: 17 (ref 9–23)
BUN: 13 mg/dL (ref 6–24)
Bilirubin Total: 1 mg/dL (ref 0.0–1.2)
CO2: 20 mmol/L (ref 20–29)
Calcium: 9.4 mg/dL (ref 8.7–10.2)
Chloride: 101 mmol/L (ref 96–106)
Creatinine, Ser: 0.78 mg/dL (ref 0.57–1.00)
Globulin, Total: 2.6 g/dL (ref 1.5–4.5)
Glucose: 85 mg/dL (ref 70–99)
Potassium: 3.8 mmol/L (ref 3.5–5.2)
Sodium: 140 mmol/L (ref 134–144)
Total Protein: 7.1 g/dL (ref 6.0–8.5)
eGFR: 97 mL/min/{1.73_m2} (ref >59)

## 2023-08-02 LAB — VITAMIN D 25 HYDROXY (VIT D DEFICIENCY, FRACTURES): Vit D, 25-Hydroxy: 26.4 ng/mL — ABNORMAL LOW (ref 30.0–100.0)

## 2023-08-02 LAB — TSH: TSH: 0.894 u[IU]/mL (ref 0.450–4.500)

## 2023-08-02 LAB — FOLATE: Folate: 16 ng/mL (ref >3.0)

## 2023-08-02 LAB — INSULIN, RANDOM: INSULIN: 20.4 u[IU]/mL (ref 2.6–24.9)

## 2023-08-02 LAB — HEMOGLOBIN A1C
Est. average glucose Bld gHb Est-mCnc: 123 mg/dL
Hgb A1c MFr Bld: 5.9 % — ABNORMAL HIGH (ref 4.8–5.6)

## 2023-08-02 LAB — VITAMIN B12: Vitamin B-12: >2000 pg/mL — ABNORMAL HIGH (ref 232–1245)

## 2023-08-09 DIAGNOSIS — E785 Hyperlipidemia, unspecified: Secondary | ICD-10-CM | POA: Diagnosis not present

## 2023-08-09 DIAGNOSIS — I10 Essential (primary) hypertension: Secondary | ICD-10-CM | POA: Diagnosis not present

## 2023-08-09 DIAGNOSIS — M25561 Pain in right knee: Secondary | ICD-10-CM | POA: Diagnosis not present

## 2023-08-09 DIAGNOSIS — J309 Allergic rhinitis, unspecified: Secondary | ICD-10-CM | POA: Diagnosis not present

## 2023-08-15 ENCOUNTER — Encounter (INDEPENDENT_AMBULATORY_CARE_PROVIDER_SITE_OTHER): Payer: Self-pay | Admitting: Family Medicine

## 2023-08-15 ENCOUNTER — Ambulatory Visit (INDEPENDENT_AMBULATORY_CARE_PROVIDER_SITE_OTHER): Admitting: Family Medicine

## 2023-08-15 VITALS — BP 112/76 | HR 65 | Temp 97.8°F | Ht 66.0 in | Wt 253.0 lb

## 2023-08-15 DIAGNOSIS — I10 Essential (primary) hypertension: Secondary | ICD-10-CM

## 2023-08-15 DIAGNOSIS — Z6841 Body Mass Index (BMI) 40.0 and over, adult: Secondary | ICD-10-CM

## 2023-08-15 DIAGNOSIS — E785 Hyperlipidemia, unspecified: Secondary | ICD-10-CM

## 2023-08-15 DIAGNOSIS — E559 Vitamin D deficiency, unspecified: Secondary | ICD-10-CM | POA: Diagnosis not present

## 2023-08-15 DIAGNOSIS — E669 Obesity, unspecified: Secondary | ICD-10-CM

## 2023-08-15 DIAGNOSIS — R7303 Prediabetes: Secondary | ICD-10-CM

## 2023-08-15 MED ORDER — METFORMIN HCL 500 MG PO TABS: 500.0000 mg | ORAL_TABLET | Freq: Every morning | ORAL | 0 refills | Status: DC

## 2023-08-15 MED ORDER — VITAMIN D (ERGOCALCIFEROL) 1.25 MG (50000 UNIT) PO CAPS: 50000.0000 [IU] | ORAL_CAPSULE | ORAL | 0 refills | Status: DC

## 2023-08-15 NOTE — Addendum Note (Signed)
 Addended by: LAFE BAKER CROME on: 08/15/2023 04:31 PM   Modules accepted: Orders

## 2023-08-15 NOTE — Progress Notes (Signed)
 Office: [phone]  /  Fax: [phone]  WEIGHT SUMMARY AND BIOMETRICS  Anthropometric Measurements Height: 5' 6 (1.676 m) Weight: 253 lb (114.8 kg) BMI (Calculated): 40.85 Weight at Last Visit: 258 lb Weight Lost Since Last Visit: 5 lb Weight Gained Since Last Visit: 0 Starting Weight: 258 lb Total Weight Loss (lbs): 5 lb (2.268 kg) Peak Weight: 309 lb   Body Composition  Body Fat %: 45.9 % Fat Mass (lbs): 116.4 lbs Muscle Mass (lbs): 130.4 lbs Total Body Water  (lbs): 88.6 lbs Visceral Fat Rating : 13   Other Clinical Data RMR: [id number] Fasting: yes Labs: no Today's Visit #: 2 Starting Date: 08/01/23    Chief Complaint: OBESITY   Discussed the use of AI scribe software for clinical note transcription with the patient, who gave verbal consent to proceed.  History of Present Illness Mia Norris is a 43 year old female with obesity who presents for a follow-up on her obesity treatment plan.  She is adhering to a category three eating plan 90% of the time and has lost five pounds in the last two weeks. The plan involves consuming smaller portions more frequently, initially causing hunger but she adjusted by the second week. Her typical meals include two or three boiled eggs with sugar-free jelly toast for breakfast, a sandwich with meat for lunch, and snacks on berries, yogurt, and cottage cheese. She avoids grapes due to their high sugar content. The eating plan is challenging due to her work schedule from 7 AM to 4 or 5 PM, but she has managed to organize her meals better after the first week.  Recent lab results show HDL at 59, triglycerides at 107, LDL at 100, low vitamin D  at 26, and elevated B12, leading her to stop supplementation. Hemoglobin A1c is 5.9, indicating prediabetes, with normal fasting glucose and elevated insulin  at 20.4.  She experiences lower back pain, attributed to her work involving lifting. She maintains good posture to alleviate the pain.  Her exercise routine includes walking on a treadmill and doing arm lifts for 30-45 minutes, five days a week, with plans to incorporate more back and core strengthening exercises.      PHYSICAL EXAM:  Blood pressure 112/76, pulse 65, temperature 97.8 F (36.6 C), height 5' 6 (1.676 m), weight 253 lb (114.8 kg), SpO2 98%. Body mass index is 40.84 kg/m.  DIAGNOSTIC DATA REVIEWED:  BMET    Component Value Date/Time   NA 140 08/01/2023 1015   K 3.8 08/01/2023 1015   CL 101 08/01/2023 1015   CO2 20 08/01/2023 1015   GLUCOSE 85 08/01/2023 1015   GLUCOSE 73 04/14/2023 1605   BUN 13 08/01/2023 1015   CREATININE 0.78 08/01/2023 1015   CALCIUM 9.4 08/01/2023 1015   GFRNONAA >60 04/14/2023 1605   GFRAA >60 09/08/2019 2320   Lab Results  Component Value Date   HGBA1C 5.9 (H) 08/01/2023   Lab Results  Component Value Date   INSULIN  20.4 08/01/2023   Lab Results  Component Value Date   TSH 0.894 08/01/2023   CBC    Component Value Date/Time   WBC 8.9 08/01/2023 1015   WBC 8.6 04/03/2023 0745   RBC 5.21 08/01/2023 1015   RBC 4.59 04/03/2023 0745   HGB 13.8 08/01/2023 1015   HCT 45.3 08/01/2023 1015   PLT 289 08/01/2023 1015   MCV 87 08/01/2023 1015   MCH 26.5 (L) 08/01/2023 1015   MCH 27.5 04/03/2023 0745   MCHC 30.5 (L) 08/01/2023 1015  MCHC 31.7 04/03/2023 0745   RDW 12.8 08/01/2023 1015   Iron Studies No results found for: IRON, TIBC, FERRITIN, IRONPCTSAT Lipid Panel     Component Value Date/Time   CHOL 178 08/01/2023 1015   TRIG 107 08/01/2023 1015   HDL 59 08/01/2023 1015   LDLCALC 100 (H) 08/01/2023 1015   Hepatic Function Panel     Component Value Date/Time   PROT 7.1 08/01/2023 1015   ALBUMIN 4.5 08/01/2023 1015   AST 23 08/01/2023 1015   ALT 16 08/01/2023 1015   ALKPHOS 90 08/01/2023 1015   BILITOT 1.0 08/01/2023 1015   BILIDIR 0.1 09/08/2019 2320   IBILI 0.7 09/08/2019 2320      Component Value Date/Time   TSH 0.894 08/01/2023 1015    Nutritional Lab Results  Component Value Date   VD25OH 26.4 (L) 08/01/2023     Assessment and Plan Assessment & Plan Obesity She is adhering to a category three eating plan, resulting in a five-pound weight loss over two weeks. The plan, which emphasizes adequate protein and reduced simple carbohydrates, is challenging due to her work schedule, but she maintains 90% adherence. She is motivated to continue lifestyle changes to improve health. - Continue category three eating plan with focus on protein intake and reducing simple carbohydrates. - Discuss strategies to manage eating plan around work schedule. - Encourage continuation of current exercise routine with modifications: 20 minutes on treadmill with incline, arm lifts, and back and core strengthening exercises.  Prediabetes Hemoglobin A1c is 5.9, indicating prediabetes. Fasting glucose is normal, but elevated insulin  levels at 20.4 suggest pancreatic overactivity. The current eating plan aids in reducing insulin  levels and controlling hunger. Discussed potential use of medication to improve insulin  sensitivity, which could prevent diabetes and aid in weight loss. The medication is inexpensive, with minimal side effects, primarily mild queasiness if taken on an empty stomach, and potential increased fertility. - Continue current eating plan with focus on reducing carbohydrates and increasing protein. - Discuss potential use of medication to improve insulin  sensitivity if dietary changes are insufficient. - Provide educational materials on prediabetes and potential medication options.  Hypertension Blood pressure is well-controlled at 112/76. She has been halving the dose of losartan due to low blood pressure readings. Continued weight loss and lifestyle changes may allow for further reduction in antihypertensive medication. - Monitor blood pressure regularly. - Consider further reduction of antihypertensive medication if blood  pressure remains well-controlled.  Hyperlipidemia Cholesterol levels are mostly within target ranges: HDL is 59, triglycerides are 107, and LDL is 100. She is close to goal for LDL, and continued dietary management is expected to improve these numbers further. - Continue dietary management focusing on reducing LDL levels.  Vitamin D  Deficiency Vitamin D  level is 26, below the normal range, increasing risk for osteoporosis and fatigue. Likely due to insufficient sun exposure and dietary intake. She will be prescribed a high-dose vitamin D  supplement to be taken once a week for three months, with a recheck of vitamin D  levels planned. - Prescribe high-dose vitamin D  supplement to be taken once a week for three months. - Recheck vitamin D  levels in three months.     I have personally spent 60 minutes total time today in preparation, patient care, and documentation for this visit, including the following: review of clinical lab tests; review of medical history, review of new diagnoses, and nutritional education   She was informed of the importance of frequent follow up visits to maximize her success  with intensive lifestyle modifications for her multiple health conditions.    Louann Penton, MD

## 2023-08-29 ENCOUNTER — Other Ambulatory Visit: Payer: Self-pay | Admitting: Nurse Practitioner

## 2023-08-29 DIAGNOSIS — N6320 Unspecified lump in the left breast, unspecified quadrant: Secondary | ICD-10-CM

## 2023-08-30 ENCOUNTER — Encounter (INDEPENDENT_AMBULATORY_CARE_PROVIDER_SITE_OTHER): Payer: Self-pay | Admitting: Internal Medicine

## 2023-08-30 ENCOUNTER — Ambulatory Visit (INDEPENDENT_AMBULATORY_CARE_PROVIDER_SITE_OTHER): Admitting: Internal Medicine

## 2023-08-30 VITALS — BP 128/84 | HR 56 | Temp 98.3°F | Ht 66.0 in | Wt 252.0 lb

## 2023-08-30 DIAGNOSIS — R7303 Prediabetes: Secondary | ICD-10-CM | POA: Diagnosis not present

## 2023-08-30 DIAGNOSIS — E66813 Obesity, class 3: Secondary | ICD-10-CM | POA: Diagnosis not present

## 2023-08-30 DIAGNOSIS — I10 Essential (primary) hypertension: Secondary | ICD-10-CM | POA: Diagnosis not present

## 2023-08-30 DIAGNOSIS — Z6841 Body Mass Index (BMI) 40.0 and over, adult: Secondary | ICD-10-CM

## 2023-08-30 MED ORDER — METFORMIN HCL 500 MG PO TABS: 500.0000 mg | ORAL_TABLET | Freq: Every morning | ORAL | 0 refills | Status: DC

## 2023-08-30 NOTE — Progress Notes (Signed)
 Office: [phone]  /  Fax: [phone]  Weight Summary and Body Composition Analysis (BIA)  Vitals Temp: 98.3 F (36.8 C) BP: 128/84 Pulse Rate: (!) 56 SpO2: 97 %   Anthropometric Measurements Height: 5' 6 (1.676 m) Weight: 252 lb (114.3 kg) BMI (Calculated): 40.69 Weight at Last Visit: 253 lb Weight Lost Since Last Visit: 1 lb Weight Gained Since Last Visit: 0 lb Starting Weight: 258 lb Total Weight Loss (lbs): 6 lb (2.722 kg) Peak Weight: 309 lb   Body Composition  Body Fat %: 47.6 % Fat Mass (lbs): 120.4 lbs Muscle Mass (lbs): 125.8 lbs Total Body Water  (lbs): 88.6 lbs Visceral Fat Rating : 13    RMR: 1872  Today's Visit #: 3  Starting Date: 08/01/23   Subjective   Chief Complaint: Obesity  Interval History Discussed the use of AI scribe software for clinical note transcription with the patient, who gave verbal consent to proceed.  History of Present Illness   Mia Norris is a 43 year old female who presents for medical weight management.  She is following a 1500 calorie meal plan as part of her medical weight management program. She finds it challenging but is committed to adhering to the plan to improve her health. She dislikes certain foods like cottage cheese but consumes them for their nutritional benefits, often modifying them to suit her taste, such as adding pepper. She prefers [place] yogurt and incorporates it into her diet.  She is exercising 3 to 4 days a week about 45 minutes mostly cardio she reports adequate sleep denies high levels of stress.  She works two jobs, which makes her feel tired, and she sometimes skips meals. For breakfast, she prepares boiled eggs and takes bread to work for a sandwich. As an International aid/development worker in a deli, she is surrounded by tempting foods but brings her own lunch to avoid eating them. She has access to lean meats and vegetables at work, which she uses to make healthy meals.  She has adapted her diet to  include healthier versions of foods she used to enjoy, such as making veggie pizzas with chicken instead of beef and avoiding red sauce. She does not miss soda as she had already stopped consuming it. She incorporates tuna and chicken salads into her meals and is open to trying protein smoothies as a meal option.  She feels that her weight loss is not progressing quickly enough despite exercising, and she attributes this to not having a sufficient calorie deficit. She started taking metformin  last Saturday and has a 30-day supply. She is unsure if it is helping yet.       Challenges affecting patient progress: none.    Pharmacotherapy for weight management: She is currently taking Metformin  (off label use for incretin effect and / or insulin  resistance and / or diabetes prevention) with adequate clinical response  and without side effects..   Assessment and Plan   Treatment Plan For Obesity:  Recommended Dietary Goals  [name] is currently in the action stage of change. As such, her goal is to continue weight management plan. She has agreed to: follow the Category 2 plan - 1200 kcal per day and patient feels like she is stuffing herself and is unable to eat the recommended amount of calories.  She is currently on a 1500-calorie meal plan her basal metabolic rate is around 1800 this is likely the reason why her weight loss has been slow.  She is agreeable to reducing plan 1200-calorie  plan.  Behavioral Health and Counseling  We discussed the following behavioral modification strategies today: continue to work on maintaining a reduced calorie state, getting the recommended amount of protein, incorporating whole foods, making healthy choices, staying well hydrated and practicing mindfulness when eating..  Additional education and resources provided today: Category 2 meal plan with supportive documents.  Recommended Physical Activity Goals  [name] has been advised to work up to 150 minutes  of moderate intensity aerobic activity a week and strengthening exercises 2-3 times per week for cardiovascular health, weight loss maintenance and preservation of muscle mass.   She has agreed to :  continue to gradually increase the amount and intensity of exercise routine  Medical Interventions and Pharmacotherapy  We discussed various medication options to help [name] with her weight loss efforts and we both agreed to : Adequate clinical response to anti-obesity medication, continue current regimen  Associated Conditions Impacted by Obesity Treatment  Assessment & Plan Prediabetes She is on metformin , which was started recently. She is unsure if it is helping, but it is expected to take a few weeks to show effects. Continued use is advised to allow time for the medication to take effect. - Continue metformin  as prescribed. - Send a prescription refill for metformin  to ensure continuity of medication.  Class 3 severe obesity with serious comorbidity and body mass index (BMI) of 40.0 to 44.9 in adult, unspecified obesity type She is following a 1500 calorie meal plan but finds it challenging due to personal food preferences. She is motivated to improve her health and has been making dietary adjustments, such as substituting [place] yogurt for cottage cheese and modifying pizza toppings to include more vegetables. She works two jobs, which may contribute to meal skipping. The current calorie plan may not be creating a sufficient caloric deficit for weight loss, as she feels overstuffed and weight loss is slow. A reduction to a 1200 calorie plan is proposed to increase the caloric deficit and facilitate weight loss. Emphasis on protein intake is advised to suppress appetite and aid in weight management. - Adjust meal plan to 1200 calories per day to create a greater caloric deficit. - Encourage consumption of protein-rich foods to suppress appetite, such as [place] yogurt, lean meats, and protein  smoothies. - Suggest economical protein sources like beans and tuna. - Recommend preparing meals in bulk to avoid daily meal preparation stress. - Consider using protein shakes as meal replacements when necessary. - Provide educational materials on calorie and protein intake, including snack ideas and protein sources. - Follow up in 3 weeks to assess progress and make further adjustments if needed. Primary hypertension Blood pressure at goal for age and risk category.  On hydrochlorothiazide  and losartan without adverse effects.  Most recent renal parameters reviewed which showed normal electrolytes and kidney function.  Continue with weight loss therapy. Losing 10% may improve blood pressure control. Monitor for symptoms of orthostasis while losing weight. Continue current regimen and home monitoring for a goal blood pressure of 120/80.             Objective   Physical Exam:  Blood pressure 128/84, pulse (!) 56, temperature 98.3 F (36.8 C), height 5' 6 (1.676 m), weight 252 lb (114.3 kg), last menstrual period 08/29/2023, SpO2 97%. Body mass index is 40.67 kg/m.  General: She is overweight, cooperative, alert, well developed, and in no acute distress. PSYCH: Has normal mood, affect and thought process.   HEENT: EOMI, sclerae are anicteric. Lungs: Normal breathing  effort, no conversational dyspnea. Extremities: No edema.  Neurologic: No gross sensory or motor deficits. No tremors or fasciculations noted.    Diagnostic Data Reviewed:  BMET    Component Value Date/Time   NA 140 08/01/2023 1015   K 3.8 08/01/2023 1015   CL 101 08/01/2023 1015   CO2 20 08/01/2023 1015   GLUCOSE 85 08/01/2023 1015   GLUCOSE 73 04/14/2023 1605   BUN 13 08/01/2023 1015   CREATININE 0.78 08/01/2023 1015   CALCIUM 9.4 08/01/2023 1015   GFRNONAA >60 04/14/2023 1605   GFRAA >60 09/08/2019 2320   Lab Results  Component Value Date   HGBA1C 5.9 (H) 08/01/2023   Lab Results  Component Value  Date   INSULIN  20.4 08/01/2023   Lab Results  Component Value Date   TSH 0.894 08/01/2023   CBC    Component Value Date/Time   WBC 8.9 08/01/2023 1015   WBC 8.6 04/03/2023 0745   RBC 5.21 08/01/2023 1015   RBC 4.59 04/03/2023 0745   HGB 13.8 08/01/2023 1015   HCT 45.3 08/01/2023 1015   PLT 289 08/01/2023 1015   MCV 87 08/01/2023 1015   MCH 26.5 (L) 08/01/2023 1015   MCH 27.5 04/03/2023 0745   MCHC 30.5 (L) 08/01/2023 1015   MCHC 31.7 04/03/2023 0745   RDW 12.8 08/01/2023 1015   Iron Studies No results found for: IRON, TIBC, FERRITIN, IRONPCTSAT Lipid Panel     Component Value Date/Time   CHOL 178 08/01/2023 1015   TRIG 107 08/01/2023 1015   HDL 59 08/01/2023 1015   LDLCALC 100 (H) 08/01/2023 1015   Hepatic Function Panel     Component Value Date/Time   PROT 7.1 08/01/2023 1015   ALBUMIN 4.5 08/01/2023 1015   AST 23 08/01/2023 1015   ALT 16 08/01/2023 1015   ALKPHOS 90 08/01/2023 1015   BILITOT 1.0 08/01/2023 1015   BILIDIR 0.1 09/08/2019 2320   IBILI 0.7 09/08/2019 2320      Component Value Date/Time   TSH 0.894 08/01/2023 1015   Nutritional Lab Results  Component Value Date   VD25OH 26.4 (L) 08/01/2023    Medications: Outpatient Encounter Medications as of 08/30/2023  Medication Sig   atorvastatin (LIPITOR) 40 MG tablet Take 40 mg by mouth at bedtime.   Cyanocobalamin (B-12 PO) Take by mouth.   hydrochlorothiazide  (HYDRODIURIL ) 25 MG tablet Take 25 mg by mouth daily.   losartan (COZAAR) 50 MG tablet Take 50 mg by mouth daily.   Vitamin D , Ergocalciferol , (DRISDOL ) 1.25 MG (50000 UNIT) CAPS capsule Take 1 capsule (50,000 Units total) by mouth every 7 (seven) days.   [DISCONTINUED] metFORMIN  (GLUCOPHAGE ) 500 MG tablet Take 1 tablet (500 mg total) by mouth in the morning.   metFORMIN  (GLUCOPHAGE ) 500 MG tablet Take 1 tablet (500 mg total) by mouth in the morning.   No facility-administered encounter medications on file as of 08/30/2023.      Follow-Up   Return in about 3 weeks (around 09/20/2023) for Dr. Verdon.SABRA She was informed of the importance of frequent follow up visits to maximize her success with intensive lifestyle modifications for her multiple health conditions.  Attestation Statement   Reviewed by clinician on day of visit: allergies, medications, problem list, medical history, surgical history, family history, social history, and previous encounter notes.     Lucas Parker, MD

## 2023-08-30 NOTE — Assessment & Plan Note (Signed)
 Blood pressure at goal for age and risk category.  On hydrochlorothiazide  and losartan without adverse effects.  Most recent renal parameters reviewed which showed normal electrolytes and kidney function.  Continue with weight loss therapy. Losing 10% may improve blood pressure control. Monitor for symptoms of orthostasis while losing weight. Continue current regimen and home monitoring for a goal blood pressure of 120/80.

## 2023-08-30 NOTE — Assessment & Plan Note (Signed)
 She is on metformin , which was started recently. She is unsure if it is helping, but it is expected to take a few weeks to show effects. Continued use is advised to allow time for the medication to take effect. - Continue metformin  as prescribed. - Send a prescription refill for metformin  to ensure continuity of medication.

## 2023-08-30 NOTE — Assessment & Plan Note (Signed)
 She is following a 1500 calorie meal plan but finds it challenging due to personal food preferences. She is motivated to improve her health and has been making dietary adjustments, such as substituting [place] yogurt for cottage cheese and modifying pizza toppings to include more vegetables. She works two jobs, which may contribute to meal skipping. The current calorie plan may not be creating a sufficient caloric deficit for weight loss, as she feels overstuffed and weight loss is slow. A reduction to a 1200 calorie plan is proposed to increase the caloric deficit and facilitate weight loss. Emphasis on protein intake is advised to suppress appetite and aid in weight management. - Adjust meal plan to 1200 calories per day to create a greater caloric deficit. - Encourage consumption of protein-rich foods to suppress appetite, such as [place] yogurt, lean meats, and protein smoothies. - Suggest economical protein sources like beans and tuna. - Recommend preparing meals in bulk to avoid daily meal preparation stress. - Consider using protein shakes as meal replacements when necessary. - Provide educational materials on calorie and protein intake, including snack ideas and protein sources. - Follow up in 3 weeks to assess progress and make further adjustments if needed.

## 2023-09-13 ENCOUNTER — Ambulatory Visit (INDEPENDENT_AMBULATORY_CARE_PROVIDER_SITE_OTHER): Admitting: Family Medicine

## 2023-09-13 ENCOUNTER — Encounter (INDEPENDENT_AMBULATORY_CARE_PROVIDER_SITE_OTHER): Payer: Self-pay | Admitting: Family Medicine

## 2023-09-13 VITALS — BP 117/82 | HR 78 | Temp 98.7°F | Ht 66.0 in | Wt 250.0 lb

## 2023-09-13 DIAGNOSIS — Z6841 Body Mass Index (BMI) 40.0 and over, adult: Secondary | ICD-10-CM

## 2023-09-13 DIAGNOSIS — M25561 Pain in right knee: Secondary | ICD-10-CM | POA: Diagnosis not present

## 2023-09-13 DIAGNOSIS — E669 Obesity, unspecified: Secondary | ICD-10-CM | POA: Diagnosis not present

## 2023-09-13 DIAGNOSIS — E119 Type 2 diabetes mellitus without complications: Secondary | ICD-10-CM | POA: Diagnosis not present

## 2023-09-13 DIAGNOSIS — R7303 Prediabetes: Secondary | ICD-10-CM

## 2023-09-13 DIAGNOSIS — Z7984 Long term (current) use of oral hypoglycemic drugs: Secondary | ICD-10-CM

## 2023-09-13 MED ORDER — METFORMIN HCL 500 MG PO TABS: 500.0000 mg | ORAL_TABLET | Freq: Two times a day (BID) | ORAL | 0 refills | Status: DC

## 2023-09-13 NOTE — Progress Notes (Signed)
 Office: [phone]  /  Fax: [phone]  WEIGHT SUMMARY AND BIOMETRICS  Anthropometric Measurements Height: 5' 6 (1.676 m) Weight: 250 lb (113.4 kg) BMI (Calculated): 40.37 Weight at Last Visit: 252lb Weight Lost Since Last Visit: 2lb Weight Gained Since Last Visit: 0lb Starting Weight: 258lb Total Weight Loss (lbs): 8 lb (3.629 kg) Peak Weight: 309lb   Body Composition  Body Fat %: 48.3 % Fat Mass (lbs): 121.2 lbs Muscle Mass (lbs): 123 lbs Total Body Water  (lbs): 86.4 lbs Visceral Fat Rating : 14   Other Clinical Data RMR: [id number] Fasting: No Labs: No Today's Visit #: 4 Starting Date: 08/01/23    Chief Complaint: OBESITY    History of Present Illness Mia Norris is a 43 year old female who presents for obesity treatment and progress assessment.  She is adhering to a category two eating plan approximately 75% of the time, facing challenges in maintaining variety while following dietary guidelines. Working in a deli surrounded by tempting foods, she manages to avoid them. She enjoys taco salads and has made modifications such as using [place] meat and low-salt seasonings. She supplements her diet with protein shakes when unable to eat adequately.  She exercises three to four days a week for 45 minutes, focusing on cardio and strength training. Her routine includes walking for 25 minutes and performing arm lifts. She is concentrating on strengthening her arms and legs, particularly due to trouble with her knee. She uses exercise bands for resistance training.  She has lost two pounds in the last two weeks. She is currently taking metformin  in the morning with an apple or applesauce. She experiences diarrhea if she consumes high-carb or high-calorie foods. She reports increased hunger in the second half of the day but manages her meals to avoid feeling too full. Her diet includes salads, cheese, milk, and eggs.  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 She has no specific plans for the upcoming weekend as she will be working.      PHYSICAL EXAM:  Blood pressure 117/82, pulse 78, temperature 98.7 F (37.1 C), height 5' 6 (1.676 m), weight 250 lb (113.4 kg), last menstrual period 08/29/2023, SpO2 98%. Body mass index is 40.35 kg/m.  DIAGNOSTIC DATA REVIEWED:  BMET    Component Value Date/Time   NA 140 08/01/2023 1015   K 3.8 08/01/2023 1015   CL 101 08/01/2023 1015   CO2 20 08/01/2023 1015   GLUCOSE 85 08/01/2023 1015   GLUCOSE 73 04/14/2023 1605   BUN 13 08/01/2023 1015   CREATININE 0.78 08/01/2023 1015   CALCIUM 9.4 08/01/2023 1015   GFRNONAA >60 04/14/2023 1605   GFRAA >60 09/08/2019 2320   Lab Results  Component Value Date   HGBA1C 5.9 (H) 08/01/2023   Lab Results  Component Value Date   INSULIN  20.4 08/01/2023   Lab Results  Component Value Date   TSH 0.894 08/01/2023   CBC    Component Value Date/Time   WBC 8.9 08/01/2023 1015   WBC 8.6 04/03/2023 0745   RBC 5.21 08/01/2023 1015   RBC 4.59 04/03/2023 0745   HGB 13.8 08/01/2023 1015   HCT 45.3 08/01/2023 1015   PLT 289 08/01/2023 1015   MCV 87 08/01/2023 1015   MCH 26.5 (L) 08/01/2023 1015   MCH 27.5 04/03/2023 0745   MCHC 30.5 (L) 08/01/2023 1015   MCHC 31.7 04/03/2023 0745   RDW 12.8 08/01/2023 1015   Iron Studies No results found  for: IRON, TIBC, FERRITIN, IRONPCTSAT Lipid Panel     Component Value Date/Time   CHOL 178 08/01/2023 1015   TRIG 107 08/01/2023 1015   HDL 59 08/01/2023 1015   LDLCALC 100 (H) 08/01/2023 1015   Hepatic Function Panel     Component Value Date/Time   PROT 7.1 08/01/2023 1015   ALBUMIN 4.5 08/01/2023 1015   AST 23 08/01/2023 1015   ALT 16 08/01/2023 1015   ALKPHOS 90 08/01/2023 1015   BILITOT 1.0 08/01/2023 1015   BILIDIR 0.1 09/08/2019 2320   IBILI 0.7 09/08/2019 2320      Component Value Date/Time   TSH 0.894 08/01/2023 1015   Nutritional Lab Results  Component Value Date   VD25OH  26.4 (L) 08/01/2023     Assessment and Plan Assessment & Plan Obesity Obesity management is ongoing with dietary modifications and exercise. She follows the category two eating plan 75% of the time, resulting in a two-pound weight loss over the last two weeks. She exercises three to four days a week, focusing on cardio and incorporating strength training for arms and legs. Protein intake is monitored, with protein shakes used as needed. The current approach is effective. - Continue category two eating plan. - Continue exercise regimen, focusing on cardio and strength training. - Monitor protein intake and use protein shakes as needed.  Type 2 diabetes mellitus Type 2 diabetes management includes metformin , currently taken in the morning. She experiences occasional diarrhea with high-carb or high-calorie foods, indicating dietary sensitivity. Hunger is more pronounced in the afternoon, suggesting the current dosing may not provide adequate coverage. A twice-daily dosing regimen is considered to improve glycemic control, with a preference for the second dose at lunch, or dinner if remembering the midday dose is challenging. - Prescribe metformin  twice daily, with the second dose at lunch or dinner, depending on her ability to remember the dose.  Right knee pain Right knee pain is managed with exercise, focusing on strengthening without harm. She is advised to work muscles to exhaustion but avoid jerky movements that could exacerbate pain. Exercise bands are recommended for controlled strengthening exercises. - Continue strengthening exercises for the right knee, focusing on controlled movements. - Use exercise bands for knee strengthening.      She was informed of the importance of frequent follow up visits to maximize her success with intensive lifestyle modifications for her multiple health conditions.    Louann Penton, MD

## 2023-09-20 ENCOUNTER — Ambulatory Visit (INDEPENDENT_AMBULATORY_CARE_PROVIDER_SITE_OTHER): Admitting: Family Medicine

## 2023-09-21 ENCOUNTER — Other Ambulatory Visit (INDEPENDENT_AMBULATORY_CARE_PROVIDER_SITE_OTHER): Payer: Self-pay | Admitting: Family Medicine

## 2023-09-21 DIAGNOSIS — E559 Vitamin D deficiency, unspecified: Secondary | ICD-10-CM

## 2023-09-22 ENCOUNTER — Other Ambulatory Visit (INDEPENDENT_AMBULATORY_CARE_PROVIDER_SITE_OTHER): Payer: Self-pay | Admitting: Family Medicine

## 2023-09-22 DIAGNOSIS — E559 Vitamin D deficiency, unspecified: Secondary | ICD-10-CM

## 2023-10-06 ENCOUNTER — Ambulatory Visit
Admission: RE | Admit: 2023-10-06 | Discharge: 2023-10-06 | Disposition: A | Discharge status: Home / Self Care | Source: Ambulatory Visit | Attending: Nurse Practitioner | Admitting: Nurse Practitioner

## 2023-10-06 DIAGNOSIS — N6325 Unspecified lump in the left breast, overlapping quadrants: Secondary | ICD-10-CM | POA: Diagnosis not present

## 2023-10-06 DIAGNOSIS — R928 Other abnormal and inconclusive findings on diagnostic imaging of breast: Secondary | ICD-10-CM | POA: Diagnosis not present

## 2023-10-06 DIAGNOSIS — N6320 Unspecified lump in the left breast, unspecified quadrant: Secondary | ICD-10-CM

## 2023-10-11 ENCOUNTER — Encounter (INDEPENDENT_AMBULATORY_CARE_PROVIDER_SITE_OTHER): Payer: Self-pay | Admitting: Family Medicine

## 2023-10-11 ENCOUNTER — Ambulatory Visit (INDEPENDENT_AMBULATORY_CARE_PROVIDER_SITE_OTHER): Admitting: Family Medicine

## 2023-10-11 VITALS — BP 114/70 | HR 90 | Temp 98.5°F | Ht 66.0 in | Wt 248.0 lb

## 2023-10-11 DIAGNOSIS — Z6841 Body Mass Index (BMI) 40.0 and over, adult: Secondary | ICD-10-CM

## 2023-10-11 DIAGNOSIS — E559 Vitamin D deficiency, unspecified: Secondary | ICD-10-CM | POA: Diagnosis not present

## 2023-10-11 DIAGNOSIS — E669 Obesity, unspecified: Secondary | ICD-10-CM

## 2023-10-11 DIAGNOSIS — I10 Essential (primary) hypertension: Secondary | ICD-10-CM | POA: Diagnosis not present

## 2023-10-11 DIAGNOSIS — R7303 Prediabetes: Secondary | ICD-10-CM | POA: Diagnosis not present

## 2023-10-11 MED ORDER — VITAMIN D (ERGOCALCIFEROL) 1.25 MG (50000 UNIT) PO CAPS
50000.0000 [IU] | ORAL_CAPSULE | ORAL | 0 refills | Status: DC
Start: 2023-10-11 — End: 2023-11-07

## 2023-10-11 MED ORDER — METFORMIN HCL 500 MG PO TABS: 500.0000 mg | ORAL_TABLET | Freq: Two times a day (BID) | ORAL | 0 refills | Status: DC

## 2023-10-11 NOTE — Progress Notes (Signed)
 Office: [phone]  /  Fax: [phone]  WEIGHT SUMMARY AND BIOMETRICS  Anthropometric Measurements Height: 5' 6 (1.676 m) Weight: 248 lb (112.5 kg) BMI (Calculated): 40.05 Weight at Last Visit: 250 lb Weight Lost Since Last Visit: 2 lb Weight Gained Since Last Visit: 0 Starting Weight: 258 lb Total Weight Loss (lbs): 10 lb (4.536 kg) Peak Weight: 309 lb   Body Composition  Body Fat %: 47.4 % Fat Mass (lbs): 117.6 lbs Muscle Mass (lbs): 123.8 lbs Total Body Water  (lbs): 85.8 lbs Visceral Fat Rating : 13   Other Clinical Data RMR: [id number] Fasting: no Labs: no Today's Visit #: 5 Starting Date: 08/01/23    Chief Complaint: OBESITY   History of Present Illness Mia Norris is a 43 year old female with obesity and hypertension who presents for obesity treatment plan assessment and progress evaluation.  She adheres to the category two eating plan approximately 70% of the time, focusing on protein, fruit, and vegetable intake. She attempts to maintain adequate hydration but occasionally skips meals. Her exercise routine includes two to three days per week of cardio activities, such as walking for 20-30 minutes, arm lifts, leg presses, and stomach crunches at [employer], limiting each exercise to 10-12 minutes to avoid overexertion. She has lost two pounds in the last month.  She is currently on hydrochlorothiazide  25 mg and losartan 50 mg daily for hypertension management.  She experiences sneezing and a tickly cough, which she attributes to allergies, possibly exacerbated by her son's recent illness. She uses a nasal spray, which helps alleviate congestion.  Her appetite has decreased, and she often eats only small portions. She struggles to consume enough protein and supplements her intake with beef sticks, protein shakes, and occasionally dairy products like string cheese and Fairlife milk. She also tries unsweetened almond milk.  She is taking metformin  and  reports no significant issues with the medication, aside from increased hunger and gastrointestinal effects if she eats certain foods, such as bread. She is mindful of her diet to avoid these side effects.      PHYSICAL EXAM:  Blood pressure 114/70, pulse 90, temperature 98.5 F (36.9 C), height 5' 6 (1.676 m), weight 248 lb (112.5 kg), SpO2 97%. Body mass index is 40.03 kg/m.  DIAGNOSTIC DATA REVIEWED:  BMET    Component Value Date/Time   NA 140 08/01/2023 1015   K 3.8 08/01/2023 1015   CL 101 08/01/2023 1015   CO2 20 08/01/2023 1015   GLUCOSE 85 08/01/2023 1015   GLUCOSE 73 04/14/2023 1605   BUN 13 08/01/2023 1015   CREATININE 0.78 08/01/2023 1015   CALCIUM 9.4 08/01/2023 1015   GFRNONAA >60 04/14/2023 1605   GFRAA >60 09/08/2019 2320   Lab Results  Component Value Date   HGBA1C 5.9 (H) 08/01/2023   Lab Results  Component Value Date   INSULIN  20.4 08/01/2023   Lab Results  Component Value Date   TSH 0.894 08/01/2023   CBC    Component Value Date/Time   WBC 8.9 08/01/2023 1015   WBC 8.6 04/03/2023 0745   RBC 5.21 08/01/2023 1015   RBC 4.59 04/03/2023 0745   HGB 13.8 08/01/2023 1015   HCT 45.3 08/01/2023 1015   PLT 289 08/01/2023 1015   MCV 87 08/01/2023 1015   MCH 26.5 (L) 08/01/2023 1015   MCH 27.5 04/03/2023 0745   MCHC 30.5 (L) 08/01/2023 1015   MCHC 31.7 04/03/2023 0745   RDW 12.8 08/01/2023 1015   Iron Studies  No results found for: IRON, TIBC, FERRITIN, IRONPCTSAT Lipid Panel     Component Value Date/Time   CHOL 178 08/01/2023 1015   TRIG 107 08/01/2023 1015   HDL 59 08/01/2023 1015   LDLCALC 100 (H) 08/01/2023 1015   Hepatic Function Panel     Component Value Date/Time   PROT 7.1 08/01/2023 1015   ALBUMIN 4.5 08/01/2023 1015   AST 23 08/01/2023 1015   ALT 16 08/01/2023 1015   ALKPHOS 90 08/01/2023 1015   BILITOT 1.0 08/01/2023 1015   BILIDIR 0.1 09/08/2019 2320   IBILI 0.7 09/08/2019 2320      Component Value Date/Time    TSH 0.894 08/01/2023 1015   Nutritional Lab Results  Component Value Date   VD25OH 26.4 (L) 08/01/2023     Assessment and Plan Assessment & Plan Obesity Obesity management is ongoing with a focus on dietary adherence and exercise. She is following the category two eating plan about 70% of the time and is working on meeting her protein, fruit, and vegetable goals. She is exercising two to three days per week, primarily cardio, and has lost two pounds in the last month. There is a need to improve meal regularity as she is skipping meals at times. - Continue category two eating plan - Encourage meeting protein, fruit, and vegetable goals - Encourage regular meal consumption - Continue exercise regimen of two to three days per week  Essential hypertension Hypertension is being managed with hydrochlorothiazide  25 mg and losartan 50 mg daily. The current plan is to continue these medications while working on diet, exercise, and weight loss to aid in blood pressure control. - Continue hydrochlorothiazide  25 mg daily - Continue losartan 50 mg daily - Emphasize diet and exercise for blood pressure management  Prediabetes Prediabetes is being managed with metformin . There are concerns about social media misinformation regarding metformin , but evidence supports its use for longevity. She reports increased hunger and gastrointestinal effects if dietary indiscretions occur, such as consuming high-fat or high-carbohydrate foods. - Continue metformin  - Refill metformin  prescription - Continue diet, exercise and weight loss as discussed today as an important part of the treatment plan   Vitamin D  deficiency Vitamin D  deficiency is being managed with supplementation. - Continue vitamin D  supplementation - Refill vitamin D  prescription  Follow-Up Follow-up appointments need to be rescheduled and planned. - Reschedule next appointment for September 28th or 29th - Schedule another follow-up  appointment four weeks after the rescheduled appointment     She was informed of the importance of frequent follow up visits to maximize her success with intensive lifestyle modifications for her multiple health conditions.    Louann Penton, MD

## 2023-11-06 ENCOUNTER — Ambulatory Visit (INDEPENDENT_AMBULATORY_CARE_PROVIDER_SITE_OTHER): Admitting: Family Medicine

## 2023-11-06 NOTE — Progress Notes (Unsigned)
 SUBJECTIVE: Discussed the use of AI scribe software for clinical note transcription with the patient, who gave verbal consent to proceed.  Chief Complaint: Obesity  Interim History: She is down 5 lbs since her last visit Down 15 lbs overall TBW loss of 5.8%  Mia Norris is here to discuss her progress with her obesity treatment plan. She is on the Category 3 Plan and states she is following her eating plan approximately 65 % of the time. She states she is exercising walking/arm exercises 45 minutes 2 times per week.  Mia Norris is a 43 year old female with obesity who presents for follow-up of her treatment plan.  She has lost 15 pounds and is pleased with her progress, although she faces challenges with maintaining a regular gym schedule.  She currently exercises two days a week and is working on building a routine.  She is actively tracking her calories using apps and is mindful of her sugar intake, avoiding sweets and high-sugar fruits.  She eats a lot of salads and ensures she has snacks like boiled eggs and cheese to maintain her protein intake.                      She sometimes forgets to eat due to a lack of appetite but is making an effort to eat regularly. She is trying to increase her protein intake, typically consuming two to three boiled eggs for breakfast, and avoids bread, focusing on eating deli meats. She drinks plenty of water  throughout the day.We discussed the importance of regular meals and adequate protein intake for weight loss and overall health. She is not having excessive cravings. Using YUKA app to help determine better choices.   She has a history of prediabetes, hypertension, hyperlipidemia, and vitamin D  and B12 deficiencies. She is on metformin  without any reported side effects. She is not currently taking B12 supplements as her levels were previously high, but she is open to resuming them if needed.  She works in [employer] and has a history of smoking,  having quit two to three years ago after smoking for over 20 years. She reports a persistent cough but has not been sick recently. No excessive hunger, no nausea, no stomach upset with metformin , no problems with vitamin D  supplementation. OBJECTIVE: Visit Diagnoses: Problem List Items Addressed This Visit     Primary hypertension   Hyperlipidemia   Prediabetes - Primary   Relevant Medications   metFORMIN  (GLUCOPHAGE ) 500 MG tablet   Other Visit Diagnoses       Vitamin D  deficiency       Relevant Medications   Vitamin D , Ergocalciferol , (DRISDOL ) 1.25 MG (50000 UNIT) CAPS capsule     Chronic cough- with 20+ pack year hx of cigarette smoking         Elevated vitamin B12 level         Obesity beginning BMI 41.77       Relevant Medications   metFORMIN  (GLUCOPHAGE ) 500 MG tablet     BMI 39.0-39.9,adult Current BMI 39.3         Obesity She has lost 15 pounds and is maintaining consistency with exercise and diet. We discussed the importance of regular meals and adequate protein intake for weight loss and overall health. She is working to incorporate adequate protein during working hours but does occasionally skip meals.  - Continue current exercise routine and aim to increase to four days a week as tolerated. - Maintain calorie tracking  and focus on reducing visceral fat. - Increase protein intake to approximately 100 grams per day, aiming for 35 grams per meal. - Consider making egg cups with added protein for breakfast. - Ensure regular meals to prevent metabolic slowdown.  Prediabetes At risk for type 2 diabetes due to obesity and visceral fat. Current weight loss and lifestyle changes are positive steps towards reducing this risk. Lab Results  Component Value Date   HGBA1C 5.9 (H) 08/01/2023   Lab Results  Component Value Date   LDLCALC 100 (H) 08/01/2023   CREATININE 0.78 08/01/2023  Continue working on nutrition plan to decrease simple carbohydrates, increase lean proteins  and exercise to promote weight loss, improve glycemic control and prevent progression to Type 2 diabetes.  On metformin  500 mg BID and tolerates well currently. Refilled today - Continue weight loss efforts to reduce the risk of progression to type 2 diabetes. Meds ordered this encounter  Medications   metFORMIN  (GLUCOPHAGE ) 500 MG tablet    Sig: Take 1 tablet (500 mg total) by mouth 2 (two) times daily with a meal.    Dispense:  60 tablet    Refill:  0   Vitamin D , Ergocalciferol , (DRISDOL ) 1.25 MG (50000 UNIT) CAPS capsule    Sig: Take 1 capsule (50,000 Units total) by mouth every 7 (seven) days.    Dispense:  4 capsule    Refill:  0    Hypertension Blood pressure is well-controlled at 109/73 mmHg, likely due to weight loss and increased physical activity. BP Readings from Last 3 Encounters:  11/07/23 109/73  10/11/23 114/70  09/13/23 117/82   On losartan 50mg  daily and hydrochlorothiazide  25 mg daily. No SE.  - Continue current lifestyle modifications to maintain blood pressure control.  Hyperlipidemia Weight loss and dietary changes are likely beneficial for lipid levels. On lipitor 40 mg daily. Reports no SE.  Last lipids Lab Results  Component Value Date   CHOL 178 08/01/2023   HDL 59 08/01/2023   LDLCALC 100 (H) 08/01/2023   TRIG 107 08/01/2023   LDL above goal. HDL and Trig at goals.  Continue Lipitor 40 mg daily.  - Continue dietary modifications and weight loss efforts to improve lipid profile.  Chronic Cough Chronic cough possibly related to smoking history. Quit smoking 2-3 years ago after smoking for over 20+  years. Further evaluation needed to rule out chronic lung damage and discussed needs evaluation by her PCP.  - Refer to primary care physician for further evaluation, including a possible chest x-ray. - Recommend over-the-counter Delsym for symptomatic relief of cough.  Vitamin D  Deficiency Vitamin D  is not at goal of 50.  Most recent vitamin D  level  was 26.4. She is on  prescription ergocalciferol  50,000 IU weekly. No N/V or muscle weakness with Ergocalciferol .  Lab Results  Component Value Date   VD25OH 26.4 (L) 08/01/2023    Plan: Continue and refill  prescription ergocalciferol  50,000 IU weekly Low vitamin D  levels can be associated with adiposity and may result in leptin resistance and weight gain. Also associated with fatigue.  Currently on vitamin D  supplementation without any adverse effects such as nausea, vomiting or muscle weakness.                                 Vitamin B12 deficiency Previous B12 levels were high, so supplementation was paused. B12 levels will be rechecked to determine if supplementation is needed, especially  since metformin  can affect B12 absorption. - Recheck B12 levels in the near future to assess the need for supplementation.  Vitals Temp: 98.4 F (36.9 C) BP: 109/73 Pulse Rate: 69 SpO2: 98 %   Anthropometric Measurements Height: 5' 6 (1.676 m) Weight: 243 lb (110.2 kg) BMI (Calculated): 39.24 Weight at Last Visit: 248 lb Weight Lost Since Last Visit: 5 lb Weight Gained Since Last Visit: 0 Starting Weight: 258 lb Total Weight Loss (lbs): 15 lb (6.804 kg) Peak Weight: 309 lb   Body Composition  Body Fat %: 47.7 % Fat Mass (lbs): 116 lbs Muscle Mass (lbs): 120.8 lbs Total Body Water  (lbs): 85.6 lbs Visceral Fat Rating : 13   Other Clinical Data Fasting: No Labs: No Today's Visit #: 6 Starting Date: 08/01/23     ASSESSMENT AND PLAN:  Diet: Kerianne is currently in the action stage of change. As such, her goal is to continue with weight loss efforts. She has agreed to Category 3 Plan.  Exercise: Tylicia has been instructed to work up to a goal of 150 minutes of combined cardio and strengthening exercise per week and to continue exercising as is for weight loss and overall health benefits.   Behavior Modification:  We discussed the following Behavioral Modification  Strategies today: increasing lean protein intake, decreasing simple carbohydrates, increasing vegetables, increase H2O intake, increase high fiber foods, no skipping meals, meal planning and cooking strategies, avoiding temptations, and planning for success. We discussed various medication options to help Apryle with her weight loss efforts and we both agreed to continue current treatment plan.  Return in about 4 weeks (around 12/05/2023).SABRA She was informed of the importance of frequent follow up visits to maximize her success with intensive lifestyle modifications for her multiple health conditions.  Attestation Statements:   Reviewed by clinician on day of visit: allergies, medications, problem list, medical history, surgical history, family history, social history, and previous encounter notes.   Time spent on visit including pre-visit chart review and post-visit care and charting was 34 minutes.    Lenville Hibberd, PA-C

## 2023-11-07 ENCOUNTER — Ambulatory Visit (INDEPENDENT_AMBULATORY_CARE_PROVIDER_SITE_OTHER): Admitting: Physician Assistant

## 2023-11-07 ENCOUNTER — Encounter (INDEPENDENT_AMBULATORY_CARE_PROVIDER_SITE_OTHER): Payer: Self-pay | Admitting: Physician Assistant

## 2023-11-07 VITALS — BP 109/73 | HR 69 | Temp 98.4°F | Ht 66.0 in | Wt 243.0 lb

## 2023-11-07 DIAGNOSIS — R7303 Prediabetes: Secondary | ICD-10-CM

## 2023-11-07 DIAGNOSIS — E785 Hyperlipidemia, unspecified: Secondary | ICD-10-CM

## 2023-11-07 DIAGNOSIS — E559 Vitamin D deficiency, unspecified: Secondary | ICD-10-CM | POA: Diagnosis not present

## 2023-11-07 DIAGNOSIS — I10 Essential (primary) hypertension: Secondary | ICD-10-CM

## 2023-11-07 DIAGNOSIS — R7989 Other specified abnormal findings of blood chemistry: Secondary | ICD-10-CM

## 2023-11-07 DIAGNOSIS — Z6839 Body mass index (BMI) 39.0-39.9, adult: Secondary | ICD-10-CM

## 2023-11-07 DIAGNOSIS — R053 Chronic cough: Secondary | ICD-10-CM | POA: Diagnosis not present

## 2023-11-07 DIAGNOSIS — Z87891 Personal history of nicotine dependence: Secondary | ICD-10-CM

## 2023-11-07 DIAGNOSIS — E669 Obesity, unspecified: Secondary | ICD-10-CM

## 2023-11-07 MED ORDER — VITAMIN D (ERGOCALCIFEROL) 1.25 MG (50000 UNIT) PO CAPS: 50000.0000 [IU] | ORAL_CAPSULE | ORAL | 0 refills | Status: DC

## 2023-11-07 MED ORDER — METFORMIN HCL 500 MG PO TABS: 500.0000 mg | ORAL_TABLET | Freq: Two times a day (BID) | ORAL | 0 refills | Status: DC

## 2023-11-16 ENCOUNTER — Ambulatory Visit (INDEPENDENT_AMBULATORY_CARE_PROVIDER_SITE_OTHER): Admitting: Gastroenterology

## 2023-11-22 ENCOUNTER — Encounter (INDEPENDENT_AMBULATORY_CARE_PROVIDER_SITE_OTHER): Payer: Self-pay | Admitting: Gastroenterology

## 2023-12-04 ENCOUNTER — Ambulatory Visit (INDEPENDENT_AMBULATORY_CARE_PROVIDER_SITE_OTHER): Admitting: Family Medicine

## 2023-12-04 ENCOUNTER — Encounter (INDEPENDENT_AMBULATORY_CARE_PROVIDER_SITE_OTHER): Payer: Self-pay | Admitting: Family Medicine

## 2023-12-04 VITALS — BP 108/75 | HR 75 | Temp 97.4°F | Ht 66.0 in | Wt 247.0 lb

## 2023-12-04 DIAGNOSIS — R7303 Prediabetes: Secondary | ICD-10-CM | POA: Diagnosis not present

## 2023-12-04 DIAGNOSIS — E559 Vitamin D deficiency, unspecified: Secondary | ICD-10-CM | POA: Diagnosis not present

## 2023-12-04 DIAGNOSIS — E669 Obesity, unspecified: Secondary | ICD-10-CM

## 2023-12-04 DIAGNOSIS — Z6839 Body mass index (BMI) 39.0-39.9, adult: Secondary | ICD-10-CM

## 2023-12-04 DIAGNOSIS — E7800 Pure hypercholesterolemia, unspecified: Secondary | ICD-10-CM

## 2023-12-04 DIAGNOSIS — E785 Hyperlipidemia, unspecified: Secondary | ICD-10-CM

## 2023-12-04 MED ORDER — METFORMIN HCL 500 MG PO TABS: 500.0000 mg | ORAL_TABLET | Freq: Two times a day (BID) | ORAL | 0 refills | Status: DC

## 2023-12-04 MED ORDER — VITAMIN D (ERGOCALCIFEROL) 1.25 MG (50000 UNIT) PO CAPS: 50000.0000 [IU] | ORAL_CAPSULE | ORAL | 0 refills | Status: DC

## 2023-12-04 NOTE — Progress Notes (Signed)
 Office: [phone]  /  Fax: [phone]  WEIGHT SUMMARY AND BIOMETRICS  Anthropometric Measurements Height: 5' 6 (1.676 m) Weight: 247 lb (112 kg) BMI (Calculated): 39.89 Weight at Last Visit: 243 lb Weight Lost Since Last Visit: 0 Weight Gained Since Last Visit: 4 lb Starting Weight: 258 lb Total Weight Loss (lbs): 11 lb (4.99 kg) Peak Weight: 309 lb   Body Composition  Body Fat %: 48.1 % Fat Mass (lbs): 119 lbs Muscle Mass (lbs): 122 lbs Total Body Water  (lbs): 86 lbs Visceral Fat Rating : 13   Other Clinical Data RMR: [id number] Fasting: no Labs: no Today's Visit #: 7 Starting Date: 08/01/23    Chief Complaint: OBESITY    History of Present Illness Mia Norris is a 43 year old female with obesity and prediabetes who presents for obesity treatment and progress assessment.  She has been following a low-carb eating plan about 65% of the time and has gained four pounds since her last visit. She is attempting to eat more fruits and vegetables, stay hydrated, avoid skipping meals, and get seven to nine hours of sleep per night. However, she is not currently exercising and struggles with consuming the recommended amount of protein.  She is being treated for prediabetes with metformin  500 mg twice daily. Her appetite is increasing, but she often has to make herself eat due to a lack of hunger.  She is also being treated for vitamin D  deficiency with prescription ergocalciferol  50,000 IU weekly.  She has missed a few doses of atorvastatin, which she takes at night, due to forgetting to take it before bed. She mentions that she takes all her medications in the morning and sometimes forgets the nighttime dose.  She works in [employer] and is mindful of her food choices, avoiding candy and preferring simple meals. She reports no significant stress except for a recent issue with her car and ensures she gets enough rest to function well at work.      PHYSICAL  EXAM:  Blood pressure 108/75, pulse 75, temperature (!) 97.4 F (36.3 C), height 5' 6 (1.676 m), weight 247 lb (112 kg), SpO2 99%. Body mass index is 39.87 kg/m.  DIAGNOSTIC DATA REVIEWED:  BMET    Component Value Date/Time   NA 140 08/01/2023 1015   K 3.8 08/01/2023 1015   CL 101 08/01/2023 1015   CO2 20 08/01/2023 1015   GLUCOSE 85 08/01/2023 1015   GLUCOSE 73 04/14/2023 1605   BUN 13 08/01/2023 1015   CREATININE 0.78 08/01/2023 1015   CALCIUM 9.4 08/01/2023 1015   GFRNONAA >60 04/14/2023 1605   GFRAA >60 09/08/2019 2320   Lab Results  Component Value Date   HGBA1C 5.9 (H) 08/01/2023   Lab Results  Component Value Date   INSULIN  20.4 08/01/2023   Lab Results  Component Value Date   TSH 0.894 08/01/2023   CBC    Component Value Date/Time   WBC 8.9 08/01/2023 1015   WBC 8.6 04/03/2023 0745   RBC 5.21 08/01/2023 1015   RBC 4.59 04/03/2023 0745   HGB 13.8 08/01/2023 1015   HCT 45.3 08/01/2023 1015   PLT 289 08/01/2023 1015   MCV 87 08/01/2023 1015   MCH 26.5 (L) 08/01/2023 1015   MCH 27.5 04/03/2023 0745   MCHC 30.5 (L) 08/01/2023 1015   MCHC 31.7 04/03/2023 0745   RDW 12.8 08/01/2023 1015   Iron Studies No results found for: IRON, TIBC, FERRITIN, IRONPCTSAT Lipid Panel  Component Value Date/Time   CHOL 178 08/01/2023 1015   TRIG 107 08/01/2023 1015   HDL 59 08/01/2023 1015   LDLCALC 100 (H) 08/01/2023 1015   Hepatic Function Panel     Component Value Date/Time   PROT 7.1 08/01/2023 1015   ALBUMIN 4.5 08/01/2023 1015   AST 23 08/01/2023 1015   ALT 16 08/01/2023 1015   ALKPHOS 90 08/01/2023 1015   BILITOT 1.0 08/01/2023 1015   BILIDIR 0.1 09/08/2019 2320   IBILI 0.7 09/08/2019 2320      Component Value Date/Time   TSH 0.894 08/01/2023 1015   Nutritional Lab Results  Component Value Date   VD25OH 26.4 (L) 08/01/2023     Assessment and Plan Assessment & Plan Obesity Obesity management is ongoing. She follows a low-carb  eating plan 65% of the time, which is insufficient for effective weight loss. She has gained 4 pounds in the last month, is not exercising, and struggles with protein intake. Her appetite is low, and she often eats without hunger. - Educate on the importance of a strict low-carb diet, adequate protein intake, and exercise for weight loss. - Explain calorie budgeting and the importance of muscle mass in metabolism. - Recommend using My Fitness Pal or Lose It for journaling to track calorie and protein intake. - Advise on a 10:1 protein-to-calorie ratio for food choices. - Encourage exercise to increase muscle mass and metabolism.  Prediabetes Prediabetes is managed with metformin  500 mg twice daily and dietary modifications to decrease simple carbohydrates. - Continue metformin  500 mg twice daily. - Order A1c test to monitor glucose control.  Hyperlipidemia Hyperlipidemia is managed with atorvastatin. She has missed some doses due to timing issues. - Consider switching atorvastatin to morning dosing if nighttime dosing is problematic. - Order cholesterol level test to assess the effectiveness of the current regimen.  Vitamin D  deficiency Vitamin D  deficiency is treated with ergocalciferol  50,000 IU weekly. - Refill ergocalciferol  50,000 IU weekly. - Order vitamin D  level test.     I personally spent a total of 40 minutes in the care of the patient today including preparing to see the patient, performing a medically appropriate evaluation of current problems, placing orders in the EMR, documenting clinical information in the EMR, customized nutritional counseling for their specific health and social needs, and education on how to keep an appropriate food journal with and without technology.  Camile was counseled on the importance of maintaining healthy lifestyle habits, including balanced nutrition, regular physical activity, and behavioral modifications, while taking antiobesity medication.   Patient verbalized understanding that medication is an adjunct to, not a replacement for, lifestyle changes and that the long-term success and weight maintenance depend on continued adherence to these strategies.   Serenidy was informed of the importance of frequent follow up visits to maximize her success with intensive lifestyle modifications for her obesity and obesity related health conditions as recommended by USPSTF and CMS guidelines   Louann Penton, MD

## 2023-12-09 ENCOUNTER — Encounter (INDEPENDENT_AMBULATORY_CARE_PROVIDER_SITE_OTHER): Payer: Self-pay

## 2023-12-11 ENCOUNTER — Other Ambulatory Visit: Payer: Self-pay

## 2023-12-11 ENCOUNTER — Emergency Department (HOSPITAL_COMMUNITY)

## 2023-12-11 ENCOUNTER — Emergency Department (HOSPITAL_COMMUNITY)
Admission: EM | Admit: 2023-12-11 | Discharge: 2023-12-11 | Disposition: A | Discharge status: Home / Self Care | Attending: Emergency Medicine | Admitting: Emergency Medicine

## 2023-12-11 ENCOUNTER — Encounter (HOSPITAL_COMMUNITY): Payer: Self-pay | Admitting: Pharmacy Technician

## 2023-12-11 DIAGNOSIS — M545 Low back pain, unspecified: Secondary | ICD-10-CM | POA: Diagnosis not present

## 2023-12-11 DIAGNOSIS — Z79899 Other long term (current) drug therapy: Secondary | ICD-10-CM | POA: Diagnosis not present

## 2023-12-11 DIAGNOSIS — E7800 Pure hypercholesterolemia, unspecified: Secondary | ICD-10-CM | POA: Diagnosis not present

## 2023-12-11 DIAGNOSIS — R1084 Generalized abdominal pain: Secondary | ICD-10-CM | POA: Insufficient documentation

## 2023-12-11 DIAGNOSIS — R109 Unspecified abdominal pain: Secondary | ICD-10-CM

## 2023-12-11 DIAGNOSIS — D259 Leiomyoma of uterus, unspecified: Secondary | ICD-10-CM | POA: Diagnosis not present

## 2023-12-11 DIAGNOSIS — K7689 Other specified diseases of liver: Secondary | ICD-10-CM | POA: Insufficient documentation

## 2023-12-11 DIAGNOSIS — E559 Vitamin D deficiency, unspecified: Secondary | ICD-10-CM | POA: Diagnosis not present

## 2023-12-11 DIAGNOSIS — I10 Essential (primary) hypertension: Secondary | ICD-10-CM | POA: Diagnosis not present

## 2023-12-11 DIAGNOSIS — R7303 Prediabetes: Secondary | ICD-10-CM | POA: Diagnosis not present

## 2023-12-11 DIAGNOSIS — R10A1 Flank pain, right side: Secondary | ICD-10-CM | POA: Diagnosis not present

## 2023-12-11 LAB — CBC
HCT: 41.4 % (ref 36.0–46.0)
Hemoglobin: 13.6 g/dL (ref 12.0–15.0)
MCH: 27.4 pg (ref 26.0–34.0)
MCHC: 32.9 g/dL (ref 30.0–36.0)
MCV: 83.3 fL (ref 80.0–100.0)
Platelets: 296 K/uL (ref 150–400)
RBC: 4.97 MIL/uL (ref 3.87–5.11)
RDW: 14.4 % (ref 11.5–15.5)
WBC: 8.9 K/uL (ref 4.0–10.5)
nRBC: 0 % (ref 0.0–0.2)

## 2023-12-11 LAB — URINALYSIS, ROUTINE W REFLEX MICROSCOPIC
Bilirubin Urine: NEGATIVE
Glucose, UA: NEGATIVE mg/dL
Hgb urine dipstick: NEGATIVE
Ketones, ur: NEGATIVE mg/dL
Leukocytes,Ua: NEGATIVE
Nitrite: NEGATIVE
Protein, ur: NEGATIVE mg/dL
Specific Gravity, Urine: 1.02 (ref 1.005–1.030)
pH: 5 (ref 5.0–8.0)

## 2023-12-11 LAB — BASIC METABOLIC PANEL WITH GFR
Anion gap: 12 (ref 5–15)
BUN: 10 mg/dL (ref 6–20)
CO2: 22 mmol/L (ref 22–32)
Calcium: 9.1 mg/dL (ref 8.9–10.3)
Chloride: 104 mmol/L (ref 98–111)
Creatinine, Ser: 0.78 mg/dL (ref 0.44–1.00)
GFR, Estimated: >60 mL/min (ref >60)
Glucose, Bld: 95 mg/dL (ref 70–99)
Potassium: 3.6 mmol/L (ref 3.5–5.1)
Sodium: 138 mmol/L (ref 135–145)

## 2023-12-11 LAB — HEPATIC FUNCTION PANEL
ALT: 16 U/L (ref 0–44)
AST: 19 U/L (ref 15–41)
Albumin: 3.9 g/dL (ref 3.5–5.0)
Alkaline Phosphatase: 59 U/L (ref 38–126)
Bilirubin, Direct: 0.2 mg/dL (ref 0.0–0.2)
Indirect Bilirubin: 0.8 mg/dL (ref 0.3–0.9)
Total Bilirubin: 1 mg/dL (ref 0.0–1.2)
Total Protein: 6.7 g/dL (ref 6.5–8.1)

## 2023-12-11 LAB — HCG, SERUM, QUALITATIVE: Preg, Serum: NEGATIVE

## 2023-12-11 MED ORDER — KETOROLAC TROMETHAMINE 15 MG/ML IJ SOLN: 15.0000 mg | Freq: Once | INTRAMUSCULAR | Status: DC

## 2023-12-11 MED ORDER — KETOROLAC TROMETHAMINE 15 MG/ML IJ SOLN
15.0000 mg | Freq: Once | INTRAMUSCULAR | Status: AC
  Administered 2023-12-11: 15 mg via INTRAVENOUS
  Filled 2023-12-11: qty 1

## 2023-12-11 MED ORDER — IOHEXOL 350 MG/ML SOLN
75.0000 mL | Freq: Once | INTRAVENOUS | Status: AC | PRN
  Administered 2023-12-11: 75 mL via INTRAVENOUS

## 2023-12-11 MED ORDER — METHOCARBAMOL 500 MG PO TABS
500.0000 mg | ORAL_TABLET | Freq: Two times a day (BID) | ORAL | 0 refills | Status: AC
Start: 2023-12-11 — End: ?

## 2023-12-11 NOTE — Discharge Instructions (Signed)
 Your workup today was overall reassuring.  Your CT scan did show multiple large liver cysts, for which I recommend that you follow-up with your GI doctor.  Make sure to keep your appointment tomorrow.  I have prescribed you a muscle relaxer, please take at night do not drink or drive as this can make you sleepy.  Please make sure to follow-up with your primary care doctor as he would likely benefit from physical therapy referral

## 2023-12-11 NOTE — ED Notes (Signed)
 Hepatic Function added on to previously drawn labs after contacting main lab.

## 2023-12-11 NOTE — ED Triage Notes (Signed)
 Pt c/o right flank pain radiating to back for 4-5 days. Denies N/V/D. Hx of cyst in liver.

## 2023-12-11 NOTE — ED Provider Notes (Cosign Needed Addendum)
 [place] EMERGENCY DEPARTMENT AT [facility] Provider Note   CSN: [id number] Arrival date & time: 12/11/23  1002     Patient presents with: Flank Pain   Mia Norris is a 43 y.o. female.   HPI 43 year old female with a known liver cyst, obesity, hyperlipidemia, hypertension, currently undergoing weight loss who presented to the emergency department with complaints of abdominal soreness and back pain.  Patient states that the symptoms have been ongoing for the last 5 to 4 to 5 days and given the persistence of her symptoms she sought evaluation in the emergency department.  She denies any vomiting, no fevers or chills.  They state that pain seems to be all over, the states that she has a history of fibroids.  She does still have her gallbladder.  She denies any urinary symptoms, no vaginal bleeding.  She does also endorse some lower back pain that seems to be at the level of her tailbone.  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  She reports slight decrease in bowel movements since her pain started, does endorse that she has hard stools at times.    Prior to Admission medications   Medication Sig Start Date End Date Taking? Authorizing Provider  methocarbamol  (ROBAXIN ) 500 MG tablet Take 1 tablet (500 mg total) by mouth 2 (two) times daily. 12/11/23  Yes Vonn Hadassah LABOR, PA-C  atorvastatin (LIPITOR) 40 MG tablet Take 40 mg by mouth at bedtime.    [provider]  Cyanocobalamin  (B-12 PO) Take by mouth.    [provider]  hydrochlorothiazide  (HYDRODIURIL ) 25 MG tablet Take 25 mg by mouth daily.    [provider]  losartan (COZAAR) 50 MG tablet Take 50 mg by mouth daily.    [provider]  metFORMIN  (GLUCOPHAGE ) 500 MG tablet Take 1 tablet (500 mg total) by mouth 2 (two) times daily with a meal. 12/04/23   Beasley, Caren D, MD  Vitamin D , Ergocalciferol , (DRISDOL )  1.25 MG (50000 UNIT) CAPS capsule Take 1 capsule (50,000 Units total) by mouth every 7 (seven) days. 12/04/23   Verdon Parry D, MD    Allergies: Penicillins    Review of Systems Ten systems reviewed and are negative for acute change, except as noted in the HPI.   Updated Vital Signs BP 139/69   Pulse 62   Temp 98.3 F (36.8 C)   Resp 18   SpO2 95%   Physical Exam Vitals and nursing note reviewed.  Constitutional:      General: She is not in acute distress.    Appearance: She is well-developed.  HENT:     Head: Normocephalic and atraumatic.  Eyes:     Conjunctiva/sclera: Conjunctivae normal.  Cardiovascular:     Rate and Rhythm: Normal rate and regular rhythm.     Heart sounds: No murmur heard. Pulmonary:     Effort: Pulmonary effort is normal. No respiratory distress.     Breath sounds: Normal breath sounds.  Abdominal:     Palpations: Abdomen is soft.     Tenderness: There is abdominal tenderness.     Comments: Diffuse abdominal discomfort on palpation, no peritoneal signs, no guarding, abdomen is soft  Musculoskeletal:        General: No swelling.       Arms:     Cervical back: Neck supple.     Comments: Tenderness over the lumbar spine/tailbone area.  No  overlying erythema, warmth, no crepitus.  Skin:    General: Skin is warm and dry.     Capillary Refill: Capillary refill takes less than 2 seconds.  Neurological:     Mental Status: She is alert.  Psychiatric:        Mood and Affect: Mood normal.     (all labs ordered are listed, but only abnormal results are displayed) Labs Reviewed  URINALYSIS, ROUTINE W REFLEX MICROSCOPIC - Abnormal; Notable for the following components:      Result Value   APPearance HAZY (*)    All other components within normal limits  HCG, SERUM, QUALITATIVE  BASIC METABOLIC PANEL WITH GFR  CBC  HEPATIC FUNCTION PANEL    EKG: None  Radiology: CT ABDOMEN PELVIS W CONTRAST Result Date: 12/11/2023 CLINICAL DATA:  Abdominal  pain EXAM: CT ABDOMEN AND PELVIS WITH CONTRAST TECHNIQUE: Multidetector CT imaging of the abdomen and pelvis was performed using the standard protocol following bolus administration of intravenous contrast. RADIATION DOSE REDUCTION: This exam was performed according to the departmental dose-optimization program which includes automated exposure control, adjustment of the mA and/or kV according to patient size and/or use of iterative reconstruction technique. CONTRAST:  75mL OMNIPAQUE IOHEXOL 350 MG/ML SOLN COMPARISON:  04/03/2023 FINDINGS: Lower chest: No acute abnormality. Hepatobiliary: No solid liver abnormality is seen. Multiple benign liver cysts, including a large exophytic cyst arising from the inferior margin of the left lobe of the liver measuring 11.7 cm, not significantly changed. These are benign and require no specific further follow-up or characterization. No gallstones, gallbladder wall thickening, or biliary dilatation. Pancreas: Unremarkable. No pancreatic ductal dilatation or surrounding inflammatory changes. Spleen: Normal in size without significant abnormality. Adrenals/Urinary Tract: Adrenal glands are unremarkable. Kidneys are normal, without renal calculi, solid lesion, or hydronephrosis. Bladder is unremarkable. Stomach/Bowel: Stomach is within normal limits. Appendix appears normal. No evidence of bowel wall thickening, distention, or inflammatory changes. Vascular/Lymphatic: Aortic atherosclerosis. Enlarged abdominal or pelvic lymph nodes. Reproductive: Small left fundal uterine fibroid. Functional cysts and follicles of the ovaries. Other: No abdominal wall hernia or abnormality. No ascites. Musculoskeletal: No acute or significant osseous findings. IMPRESSION: 1. No acute CT findings of the abdomen or pelvis to explain abdominal pain. 2. Multiple benign liver cysts, including a large exophytic cyst arising from the inferior margin of the left lobe of the liver measuring 11.7 cm, not  significantly changed. These are benign, although may be symptomatic due to large size and mass effect. 3. Aortic atherosclerosis advanced for patient age. Aortic Atherosclerosis (ICD10-I70.0). Electronically Signed   By: Marolyn JONETTA Jaksch M.D.   On: 12/11/2023 12:10     Procedures   Medications Ordered in the ED  iohexol (OMNIPAQUE) 350 MG/ML injection 75 mL (75 mLs Intravenous Contrast Given 12/11/23 1201)                                    Medical Decision Making Amount and/or Complexity of Data Reviewed Labs: ordered. Radiology: ordered.  Risk Prescription drug management.   This patient presents to the ED for concern of abdominal pain, this involves an extensive number of treatment options, and is a complaint that carries with it a high risk of complications and morbidity.  The differential diagnosis includes constipation, SBO, less likely ischemic gut, cholecystitis, GERD, musculoskeletal pain,   Co morbidities that complicate the patient evaluation  Obesity   Additional history obtained:  Additional history obtained  from patient External records from outside source obtained and reviewed    Lab Tests:  I Ordered, and personally interpreted labs.  The pertinent results include:    - BMP, CBC, hepatic function panel unremarkable.  Pregnancy is negative.  UA without evidence of UTI.   Imaging Studies ordered:  I ordered imaging studies including CT of the abdomen I independently visualized and interpreted imaging which showed   IMPRESSION:  1. No acute CT findings of the abdomen or pelvis to explain  abdominal pain.  2. Multiple benign liver cysts, including a large exophytic cyst  arising from the inferior margin of the left lobe of the liver  measuring 11.7 cm, not significantly changed. These are benign,  although may be symptomatic due to large size and mass effect.  3. Aortic atherosclerosis advanced for patient age.    I agree with the radiologist  interpretation   Cardiac Monitoring:  NA   Medicines ordered and prescription drug management:  I ordered medication including: IV Toradol  Reevaluation of the patient after these medicines showed that the patient stayed the same I have reviewed the patients home medicines and have made adjustments as needed   Test Considered:  RUQ US     Critical Interventions:  NA    Consultations Obtained:  None   Problem List / ED Course:  43 year old female who presents emergency department with abdominal discomfort.  Denies any pain but describes it as soreness.  She has had a slight decrease in her bowel movements, overall abdominal exam not consistent with acute abdomen but given generalized soreness with multiple complaints, CT of the abdomen was obtained to rule out small bowel obstruction, possible gallbladder pathology,'s and brief evaluation of her spine given back pain.  Her labs overall do not suggest any acute infection or gallbladder pathology.  UA is negative.  CT showed multiple benign liver cysts including a large exophytic cyst which potentially could be causing discomfort due to size.  However due to overall generalized discomfort I do think that this may be due to just abdominal soreness from increased physical regimen and lifting recently.  She continues to complain of low back pain, reports that ibuprofen is not very helpful.  I did offer a muscle relaxer which she is agreeable to.  I also stressed follow-up with PCP and possible physical therapy so she could get rehab and education on proper lifting mechanics.  She is agreeable to this.  We discussed return precautions.  She voiced understanding is agreeable.    Reevaluation:  After the interventions noted above, I reevaluated the patient and found that they have :stayed the same   Social Determinants of Health:  none   Dispostion:  After consideration of the diagnostic results and the patients response to  treatment, I feel that the patent would benefit from discharge with GI and PCP followup.       Final diagnoses:  Abdominal discomfort  Liver cyst    ED Discharge Orders          Ordered    methocarbamol  (ROBAXIN ) 500 MG tablet  2 times daily        12/11/23 1354                 Vonn Hadassah DELENA DEVONNA 12/11/23 1403    Freddi Hamilton, MD 12/14/23 [phone]

## 2023-12-12 ENCOUNTER — Encounter: Payer: Self-pay | Admitting: *Deleted

## 2023-12-12 ENCOUNTER — Encounter (INDEPENDENT_AMBULATORY_CARE_PROVIDER_SITE_OTHER): Payer: Self-pay | Admitting: Gastroenterology

## 2023-12-12 ENCOUNTER — Ambulatory Visit (INDEPENDENT_AMBULATORY_CARE_PROVIDER_SITE_OTHER): Admitting: Gastroenterology

## 2023-12-12 VITALS — BP 112/70 | HR 80 | Temp 96.7°F | Ht 66.0 in | Wt 248.8 lb

## 2023-12-12 DIAGNOSIS — K7689 Other specified diseases of liver: Secondary | ICD-10-CM | POA: Diagnosis not present

## 2023-12-12 DIAGNOSIS — R1011 Right upper quadrant pain: Secondary | ICD-10-CM | POA: Diagnosis not present

## 2023-12-12 LAB — LIPID PANEL WITH LDL/HDL RATIO
Cholesterol, Total: 233 mg/dL — ABNORMAL HIGH (ref 100–199)
HDL: 61 mg/dL (ref >39)
LDL Chol Calc (NIH): 153 mg/dL — ABNORMAL HIGH (ref 0–99)
LDL/HDL Ratio: 2.5 ratio (ref 0.0–3.2)
Triglycerides: 108 mg/dL (ref 0–149)
VLDL Cholesterol Cal: 19 mg/dL (ref 5–40)

## 2023-12-12 LAB — CMP14+EGFR
ALT: 14 IU/L (ref 0–32)
AST: 16 IU/L (ref 0–40)
Albumin: 4.3 g/dL (ref 3.9–4.9)
Alkaline Phosphatase: 68 IU/L (ref 41–116)
BUN/Creatinine Ratio: 16 (ref 9–23)
BUN: 14 mg/dL (ref 6–24)
Bilirubin Total: 0.9 mg/dL (ref 0.0–1.2)
CO2: 20 mmol/L (ref 20–29)
Calcium: 9.6 mg/dL (ref 8.7–10.2)
Chloride: 101 mmol/L (ref 96–106)
Creatinine, Ser: 0.86 mg/dL (ref 0.57–1.00)
Globulin, Total: 2.5 g/dL (ref 1.5–4.5)
Glucose: 82 mg/dL (ref 70–99)
Potassium: 3.8 mmol/L (ref 3.5–5.2)
Sodium: 139 mmol/L (ref 134–144)
Total Protein: 6.8 g/dL (ref 6.0–8.5)
eGFR: 86 mL/min/1.73 (ref >59)

## 2023-12-12 LAB — VITAMIN D 25 HYDROXY (VIT D DEFICIENCY, FRACTURES): Vit D, 25-Hydroxy: 39 ng/mL (ref 30.0–100.0)

## 2023-12-12 LAB — INSULIN, RANDOM: INSULIN: 10.3 u[IU]/mL (ref 2.6–24.9)

## 2023-12-12 LAB — HEMOGLOBIN A1C
Est. average glucose Bld gHb Est-mCnc: 114 mg/dL
Hgb A1c MFr Bld: 5.6 % (ref 4.8–5.6)

## 2023-12-12 NOTE — Patient Instructions (Signed)
 We will get a function test of the gallbladder (hida scan) to ensure no issues here Continue with routine exercise and healthy eating as you are doing We may consider sending you back to the surgeon regarding liver cysts if gallbladder testing is negative  Follow up 4 months  It was a pleasure to see you today. I want to create trusting relationships with patients and provide genuine, compassionate, and quality care. I truly value your feedback! please be on the lookout for a survey regarding your visit with me today. I appreciate your input about our visit and your time in completing this!    Babe Anthis L. Sherron Mapp, MSN, APRN, AGNP-C Adult-Gerontology Nurse Practitioner [facility] at [facility]

## 2023-12-12 NOTE — Telephone Encounter (Signed)
 Pt states she had to reschedule the HIDA scan until next week on 12/19/23.  [facility] about Mia Norris for HIDA: No PA required, ref # [id number]

## 2023-12-12 NOTE — Progress Notes (Addendum)
 Referring Provider: RAYNALDO DECK PUBLI* Primary Care Physician:  [facility] Primary GI Physician: Dr. Eartha   Chief Complaint  Patient presents with   Abdominal Pain    Pt arrives due to abdominal pain on right side. Begins in right upper quadrant and radiates to back. Stomach feels sore. Did go to ER yesterday. Had CT but did not show cause of abdominal pain. No nausea/fever. No fever.    HPI:   Mia Norris is a 43 y.o. female with past medical history of chronic headaches, HLD, HTN   Patient presenting today for:  RUQ pain, Liver cysts  Last seen June, at that time saw surgeon at [facility] who did not think cyst was causing her pain, recommended against surgical intervention. Still with RUQ Pain, denies GERD symptoms, some lower back pain at times. Trying to exercise. Occasional constipation.   Recommended 7 day course of naprosyn , consider message/chiropractic therapy, continue with good weight management/routine exercise, alternate ice/heat for pain  Labs yesterday with normal HFP, CBC, BMP, UA  Present:  Continuing to have RUQ pain, went to the ED yesterday as she had more severe pain that radiated to her back but was told that no acute causes were found. She does note that pain seems worse when she overworks, lifting boxes at work. She has some bloating at times as well. No nausea or vomiting recently. Does not feel that eating changes her pain but she does not eat fried, greasy or spicy foods. She notes pain can be sharp at times, sometimes resting improves her pain. Does not feel that naprosyn  at last visit made any improvement. She does note that her back and R side have been hurting more over the past week. She denies fevers.    CT A/P with contrast: 12/11/23  No acute CT findings of the abdomen or pelvis to explain abdominal pain. 2. Multiple benign liver cysts, including a large exophytic cyst arising from the inferior margin of the left  lobe of the liver measuring 11.7 cm, not significantly changed. These are benign, although may be symptomatic due to large size and mass effect. 3. Aortic atherosclerosis advanced for patient age.  Last Endoscopy: 04/2023  - 1 cm hiatal hernia.  - Normal stomach. Biopsied.  Normal examined duodenum. Biopsied. A. SMALL BOWEL, BIOPSY:  Duodenal mucosa with normal villous architecture.  No villous atrophy or increased intraepithelial lymphocytes.  B. STOMACH, BIOPSY:  Gastric antral and oxyntic mucosa with reactive gastropathy.  Negative for Helicobacter pylori.    CT A/P WO contrast: 04/03/23 No acute findings within the abdomen or pelvis. 2. Multiple scattered liver cysts. The largest cyst is partially exophytic arising off of segment 3 of the lateral segment of left lobe measuring 12.0 x 8.0 cm. 3. Small exophytic fibroid off the left side of uterine fundus measures 1.3 cm. 4. Osteitis pubis. 5.  Aortic Atherosclerosis  Last Colonoscopy: never    [facility] Weights   12/12/23 0843  Weight: 248 lb 12.8 oz (112.9 kg)     Past Medical History:  Diagnosis Date   Back pain    Chronic headaches    High blood pressure    High cholesterol    Hyperlipidemia    Hypertension    Obesity     Past Surgical History:  Procedure Laterality Date   CESAREAN SECTION     ESOPHAGOGASTRODUODENOSCOPY (EGD) WITH PROPOFOL  N/A 04/19/2023   Procedure: ESOPHAGOGASTRODUODENOSCOPY (EGD) WITH PROPOFOL ;  Surgeon: Eartha Angelia Sieving, MD;  Location: [facility];  Service: Gastroenterology;  Laterality: N/A;  10:45AM;ASA 2    Current Outpatient Medications  Medication Sig Dispense Refill   atorvastatin (LIPITOR) 40 MG tablet Take 40 mg by mouth at bedtime.     Cyanocobalamin  (B-12 PO) Take by mouth.     hydrochlorothiazide  (HYDRODIURIL ) 25 MG tablet Take 25 mg by mouth daily.     losartan (COZAAR) 50 MG tablet Take 50 mg by mouth daily.     metFORMIN  (GLUCOPHAGE ) 500 MG tablet Take 1 tablet (500  mg total) by mouth 2 (two) times daily with a meal. 60 tablet 0   methocarbamol  (ROBAXIN ) 500 MG tablet Take 1 tablet (500 mg total) by mouth 2 (two) times daily. 20 tablet 0   Vitamin D , Ergocalciferol , (DRISDOL ) 1.25 MG (50000 UNIT) CAPS capsule Take 1 capsule (50,000 Units total) by mouth every 7 (seven) days. 4 capsule 0   No current facility-administered medications for this visit.    Allergies as of 12/12/2023 - Review Complete 12/12/2023  Allergen Reaction Noted   Penicillins  06/09/2015    Social History   Socioeconomic History   Marital status: Single    Spouse name: Not on file   Number of children: Not on file   Years of education: Not on file   Highest education level: Not on file  Occupational History   Not on file  Tobacco Use   Smoking status: Some Days    Types: Cigarettes    Passive exposure: Current   Smokeless tobacco: Never  Vaping Use   Vaping status: Some Days  Substance and Sexual Activity   Alcohol use: Not Currently    Alcohol/week: 1.0 standard drink of alcohol    Types: 1 Glasses of wine per week   Drug use: Yes    Frequency: 7.0 times per week    Types: Marijuana    Comment: last smoked 24 hours ago   Sexual activity: Not on file  Other Topics Concern   Not on file  Social History Narrative   Not on file   Social Drivers of Health   Financial Resource Strain: Not on file  Food Insecurity: No Food Insecurity (11/19/2021)   Received from [facility]   Hunger Vital Sign    Within the past 12 months, you worried that your food would run out before you got the money to buy more.: Never true    Within the past 12 months, the food you bought just didn't last and you didn't have money to get more.: Never true  Transportation Needs: Not on file  Physical Activity: Not on file  Stress: Not on file  Social Connections: Unknown (07/13/2021)   Received from [facility]   Social Network    Social Network: Not on file    Review of  systems General: negative for malaise, night sweats, fever, chills, weight loss Neck: Negative for lumps, goiter, pain and significant neck swelling Resp: Negative for cough, wheezing, dyspnea at rest CV: Negative for chest pain, leg swelling, palpitations, orthopnea GI: denies melena, hematochezia, nausea, vomiting, diarrhea, constipation, dysphagia, odyonophagia, early satiety or unintentional weight loss. +RUQ pain  MSK: Negative for joint pain or swelling, back pain, and muscle pain. Derm: Negative for itching or rash Psych: Denies depression, anxiety, memory loss, confusion. No homicidal or suicidal ideation.  Heme: Negative for prolonged bleeding, bruising easily, and swollen nodes. Endocrine: Negative for cold or heat intolerance, polyuria, polydipsia and goiter. Neuro: negative for tremor, gait imbalance, syncope and seizures. The remainder  of the review of systems is noncontributory.  Physical Exam: BP 112/70   Pulse 80   Temp (!) 96.7 F (35.9 C)   Ht 5' 6 (1.676 m)   Wt 248 lb 12.8 oz (112.9 kg)   LMP 11/13/2023 (Approximate)   BMI 40.16 kg/m  General:   Alert and oriented. No distress noted. Pleasant and cooperative.  Head:  Normocephalic and atraumatic. Eyes:  Conjuctiva clear without scleral icterus. Mouth:  Oral mucosa pink and moist. Good dentition. No lesions. Heart: Normal rate and rhythm, s1 and s2 heart sounds present.  Lungs: Clear lung sounds in all lobes. Respirations equal and unlabored. Abdomen:  +BS, soft, and non-distended. TTP of RUQ. No rebound or guarding. No HSM or masses noted. Derm: No palmar erythema or jaundice Msk:  Symmetrical without gross deformities. Normal posture. Extremities:  Without edema. Neurologic:  Alert and  oriented x4 Psych:  Alert and cooperative. Normal mood and affect.  Invalid input(s): 6 MONTHS   ASSESSMENT: Mia Norris is a 43 y.o. female presenting today for RUQ pain and liver cysts  Patient with ongoing,  intermittent RUQ/Right flank/back pain, with intermittent flares and recent ED visit with CT imaging re-demonstrating large liver cysts, no other acute findings. Recent EGD as above was unremarkable for cause of her symptomatology. she was seen by Biliary surgeon Dr. Cathern at [facility] previously who did not feel cyst was the source of her pain and actually recommended against surgical intervention due to risks associated. She denies any other GI symptoms/associated symptoms. Pain sometimes improved with stretching/rest.  Did not feel course of naprosyn  in the past helped. She has history of lumbar spinal issues. Again, Suspect this may be referred pain from her back/musculoskeletal in nature, however, given intermittent, colicky pain, will proceed with HIDA scan to rule out GB dyskinesia as well. May consider referring back to surgeon for re evaluation if HIDA scan is negative as liver cysts are quite large and potentially playing a role in her pain.    PLAN:  -continue routine exercise/healthy eating -HIDA scan -consider referral back to surgeon regarding liver cysts  All questions were answered, patient verbalized understanding and is in agreement with plan as outlined above.   Follow Up: 4 months   Lamaya Hyneman L. Mariette, MSN, APRN, AGNP-C Adult-Gerontology Nurse Practitioner [facility]  I have reviewed the note and agree with the APP's assessment as described in this progress note  Toribio Fortune, MD Gastroenterology and Hepatology [facility]

## 2023-12-14 ENCOUNTER — Encounter (HOSPITAL_COMMUNITY)

## 2023-12-19 ENCOUNTER — Encounter (HOSPITAL_COMMUNITY)
Admission: RE | Admit: 2023-12-19 | Discharge: 2023-12-19 | Disposition: A | Discharge status: Home / Self Care | Source: Ambulatory Visit | Attending: Gastroenterology | Admitting: Gastroenterology

## 2023-12-19 DIAGNOSIS — R1011 Right upper quadrant pain: Secondary | ICD-10-CM | POA: Insufficient documentation

## 2023-12-19 MED ORDER — TECHNETIUM TC 99M MEBROFENIN IV KIT
4.9000 | PACK | Freq: Once | INTRAVENOUS | Status: AC | PRN
  Administered 2023-12-19: 4.9 via INTRAVENOUS

## 2023-12-20 ENCOUNTER — Ambulatory Visit (INDEPENDENT_AMBULATORY_CARE_PROVIDER_SITE_OTHER): Payer: Self-pay | Admitting: Gastroenterology

## 2023-12-20 NOTE — Progress Notes (Signed)
 Referral sent, they will contact patient with apt

## 2023-12-21 ENCOUNTER — Other Ambulatory Visit (INDEPENDENT_AMBULATORY_CARE_PROVIDER_SITE_OTHER): Payer: Self-pay | Admitting: Gastroenterology

## 2023-12-21 MED ORDER — ONDANSETRON HCL 4 MG PO TABS: 4.0000 mg | ORAL_TABLET | Freq: Three times a day (TID) | ORAL | 1 refills | Status: AC | PRN

## 2023-12-21 MED ORDER — DICYCLOMINE HCL 10 MG PO CAPS: 10.0000 mg | ORAL_CAPSULE | Freq: Three times a day (TID) | ORAL | 1 refills | Status: AC | PRN

## 2023-12-25 NOTE — Telephone Encounter (Signed)
 Called [facility] to schd apt - Dr Cathern only does surgery for cancer related issues - I called patient to see if she wanted to see another surgeon at [facility] or did she prefer I send it to [facility] in [place] - she requested I send it to [facility] in [place] - Referral sent, they will contact patient with apt

## 2023-12-28 ENCOUNTER — Ambulatory Visit (INDEPENDENT_AMBULATORY_CARE_PROVIDER_SITE_OTHER): Payer: Self-pay | Admitting: Family Medicine

## 2023-12-28 ENCOUNTER — Encounter (INDEPENDENT_AMBULATORY_CARE_PROVIDER_SITE_OTHER): Payer: Self-pay | Admitting: Family Medicine

## 2023-12-28 VITALS — BP 135/76 | HR 60 | Temp 97.8°F | Ht 66.0 in | Wt 241.0 lb

## 2023-12-28 DIAGNOSIS — E559 Vitamin D deficiency, unspecified: Secondary | ICD-10-CM

## 2023-12-28 DIAGNOSIS — E669 Obesity, unspecified: Secondary | ICD-10-CM

## 2023-12-28 DIAGNOSIS — Z6838 Body mass index (BMI) 38.0-38.9, adult: Secondary | ICD-10-CM

## 2023-12-28 DIAGNOSIS — R7303 Prediabetes: Secondary | ICD-10-CM | POA: Diagnosis not present

## 2023-12-28 MED ORDER — VITAMIN D (ERGOCALCIFEROL) 1.25 MG (50000 UNIT) PO CAPS: 50000.0000 [IU] | ORAL_CAPSULE | ORAL | 0 refills | Status: DC

## 2023-12-28 MED ORDER — METFORMIN HCL 500 MG PO TABS: 500.0000 mg | ORAL_TABLET | Freq: Two times a day (BID) | ORAL | 0 refills | Status: DC

## 2023-12-28 NOTE — Progress Notes (Signed)
 Office: [phone]  /  Fax: [phone]  WEIGHT SUMMARY AND BIOMETRICS  Anthropometric Measurements Height: 5' 6 (1.676 m) Weight: 241 lb (109.3 kg) BMI (Calculated): 38.92 Weight at Last Visit: 247 lb Weight Lost Since Last Visit: 6 lb Weight Gained Since Last Visit: 0 Starting Weight: 258 lb Total Weight Loss (lbs): 17 lb (7.711 kg) Peak Weight: 309 lb   Body Composition  Body Fat %: 47.9 % Fat Mass (lbs): 115.4 lbs Muscle Mass (lbs): 119.2 lbs Total Body Water  (lbs): 84.4 lbs Visceral Fat Rating : 13   Other Clinical Data RMR: [id number] Fasting: no Labs: no Today's Visit #: 8 Starting Date: 08/01/23    Chief Complaint: OBESITY    History of Present Illness Mia Norris is a 43 year old female with obesity, vitamin D  deficiency, and prediabetes who presents for obesity treatment plan assessment and progress evaluation.  She has been following the category three eating plan approximately 70% of the time and engages in physical activity by exercising twice a week for 45 minutes on a treadmill. She has successfully lost six pounds over the past month. Despite facing challenges in maintaining a healthy diet, she is making efforts to eat right and counts her calories. She plans to manage her diet during Thanksgiving by cooking healthier options and controlling portion sizes. Her family supports her dietary efforts by eating what she prepares.  She is being treated for vitamin D  deficiency with prescription calciferol, 50,000 IU per week. Her recent vitamin D  level improved to 39 from 26.4.  She is managing her prediabetes through diet, exercise, and weight loss, and is on metformin  500 mg twice a day. Her hemoglobin A1c has improved to 5.6 from 5.9. She acknowledges having a sweet tooth but has learned to manage it better.  She recently visited the ER due to lower back pain, where a CT scan was performed, revealing gallbladder issues as the cause of her pain. She has  a follow-up appointment scheduled in [place] on December 3rd. She drinks a lot of water , approximately three and a half bottles a day at work, and urinates frequently, which she attributes to her hydration.      PHYSICAL EXAM:  Blood pressure 135/76, pulse 60, temperature 97.8 F (36.6 C), height 5' 6 (1.676 m), weight 241 lb (109.3 kg), last menstrual period 11/13/2023, SpO2 99%. Body mass index is 38.9 kg/m.  DIAGNOSTIC DATA REVIEWED:  BMET    Component Value Date/Time   NA 138 12/11/2023 1007   NA 139 12/11/2023 0943   K 3.6 12/11/2023 1007   CL 104 12/11/2023 1007   CO2 22 12/11/2023 1007   GLUCOSE 95 12/11/2023 1007   GLUCOSE 82 12/11/2023 0943   BUN 10 12/11/2023 1007   BUN 14 12/11/2023 0943   CREATININE 0.78 12/11/2023 1007   CALCIUM 9.1 12/11/2023 1007   GFRNONAA >60 12/11/2023 1007   GFRAA >60 09/08/2019 2320   Lab Results  Component Value Date   HGBA1C 5.6 12/11/2023   HGBA1C 5.9 (H) 08/01/2023   Lab Results  Component Value Date   INSULIN  10.3 12/11/2023   INSULIN  20.4 08/01/2023   Lab Results  Component Value Date   TSH 0.894 08/01/2023   CBC    Component Value Date/Time   WBC 8.9 12/11/2023 1007   RBC 4.97 12/11/2023 1007   HGB 13.6 12/11/2023 1007   HGB 13.8 08/01/2023 1015   HCT 41.4 12/11/2023 1007   HCT 45.3 08/01/2023 1015   PLT 296 12/11/2023  1007   PLT 289 08/01/2023 1015   MCV 83.3 12/11/2023 1007   MCV 87 08/01/2023 1015   MCH 27.4 12/11/2023 1007   MCHC 32.9 12/11/2023 1007   RDW 14.4 12/11/2023 1007   RDW 12.8 08/01/2023 1015   Iron Studies No results found for: IRON, TIBC, FERRITIN, IRONPCTSAT Lipid Panel     Component Value Date/Time   CHOL 233 (H) 12/11/2023 0943   TRIG 108 12/11/2023 0943   HDL 61 12/11/2023 0943   LDLCALC 153 (H) 12/11/2023 0943   Hepatic Function Panel     Component Value Date/Time   PROT 6.7 12/11/2023 1007   PROT 6.8 12/11/2023 0943   ALBUMIN 3.9 12/11/2023 1007   ALBUMIN 4.3  12/11/2023 0943   AST 19 12/11/2023 1007   ALT 16 12/11/2023 1007   ALKPHOS 59 12/11/2023 1007   BILITOT 1.0 12/11/2023 1007   BILITOT 0.9 12/11/2023 0943   BILIDIR 0.2 12/11/2023 1007   IBILI 0.8 12/11/2023 1007      Component Value Date/Time   TSH 0.894 08/01/2023 1015   Nutritional Lab Results  Component Value Date   VD25OH 39.0 12/11/2023   VD25OH 26.4 (L) 08/01/2023     Assessment and Plan Assessment & Plan Obesity Management is ongoing with a category three eating plan, followed approximately 70% of the time. She exercises two days a week, walking on the treadmill for 45 minutes. She has lost six pounds in the last month. Challenges with meal planning and preparation are noted, but she is actively thinking about meals and counting calories. She is making dietary changes for upcoming holidays, focusing on portion control and healthy options. Family support is present, and she is making efforts to maintain a healthy lifestyle despite challenges. - Continue category three eating plan - Continue exercise regimen - Encouraged meal planning and portion control - Supported family involvement in healthy eating  Prediabetes Managed with diet, exercise, and weight loss. She is on metformin  500 mg twice a day. Her hemoglobin A1c has improved from 5.9 to 5.6, indicating better glycemic control. She is actively working on dietary changes and exercise to manage her condition. - Continue metformin  500 mg twice a day - Encouraged continued diet and exercise regimen  Vitamin D  deficiency Managed with prescription calciferol 50,000 IU per week. Recent vitamin D  level is 39, improved from 26.4, but not yet at the goal of 50-60. Continued monitoring is necessary to achieve target levels. - Refilled prescription calciferol 50,000 IU per week - Continue to monitor vitamin D  levels      Patients who are on anti-obesity medications are counseled on the importance of maintaining healthy  lifestyle habits, including balanced nutrition, regular physical activity, and behavioral modifications,  Medication is an adjunct to, not a replacement for, lifestyle changes and that the long-term success and weight maintenance depend on continued adherence to these strategies.   Theresa was informed of the importance of frequent follow up visits to maximize her success with intensive lifestyle modifications for her obesity and obesity related health conditions as recommended by USPSTF and CMS guidelines Louann Penton, MD

## 2024-02-05 ENCOUNTER — Ambulatory Visit (INDEPENDENT_AMBULATORY_CARE_PROVIDER_SITE_OTHER): Payer: Self-pay | Admitting: Family Medicine

## 2024-02-05 ENCOUNTER — Encounter (INDEPENDENT_AMBULATORY_CARE_PROVIDER_SITE_OTHER): Payer: Self-pay | Admitting: Family Medicine

## 2024-02-05 VITALS — BP 108/70 | HR 66 | Temp 97.8°F | Ht 66.0 in | Wt 243.0 lb

## 2024-02-05 DIAGNOSIS — G8929 Other chronic pain: Secondary | ICD-10-CM

## 2024-02-05 DIAGNOSIS — E559 Vitamin D deficiency, unspecified: Secondary | ICD-10-CM | POA: Diagnosis not present

## 2024-02-05 DIAGNOSIS — Z6839 Body mass index (BMI) 39.0-39.9, adult: Secondary | ICD-10-CM

## 2024-02-05 DIAGNOSIS — M545 Low back pain, unspecified: Secondary | ICD-10-CM | POA: Diagnosis not present

## 2024-02-05 DIAGNOSIS — R7303 Prediabetes: Secondary | ICD-10-CM | POA: Diagnosis not present

## 2024-02-05 DIAGNOSIS — E669 Obesity, unspecified: Secondary | ICD-10-CM

## 2024-02-05 MED ORDER — VITAMIN D (ERGOCALCIFEROL) 1.25 MG (50000 UNIT) PO CAPS: 50000.0000 [IU] | ORAL_CAPSULE | ORAL | 0 refills | Status: AC

## 2024-02-05 MED ORDER — METFORMIN HCL 500 MG PO TABS: 500.0000 mg | ORAL_TABLET | Freq: Two times a day (BID) | ORAL | 0 refills | Status: AC

## 2024-02-05 NOTE — Progress Notes (Signed)
 "  Office: [phone]  /  Fax: [phone]  WEIGHT SUMMARY AND BIOMETRICS  Anthropometric Measurements Height: 5' 6 (1.676 m) Weight: 243 lb (110.2 kg) BMI (Calculated): 39.24 Weight at Last Visit: 241lb Weight Lost Since Last Visit: 0lb Weight Gained Since Last Visit: 2lb Starting Weight: 258lb Total Weight Loss (lbs): 15 lb (6.804 kg) Peak Weight: 309lb   Body Composition  Body Fat %: 47.9 % Fat Mass (lbs): 116.8 lbs Muscle Mass (lbs): 120.8 lbs Total Body Water  (lbs): 88 lbs Visceral Fat Rating : 13   Other Clinical Data RMR: [id number] Fasting: No Labs: no Today's Visit #: 9 Starting Date: 08/01/23    Chief Complaint: OBESITY    History of Present Illness Mia Norris is a 43 year old female with obesity and prediabetes who presents for obesity treatment plan assessment and progress evaluation.  She adheres to a category three eating plan approximately 75% of the time, focusing on increasing protein intake and consuming more fruits and vegetables. She works on maintaining adequate hydration and avoiding skipping meals. For physical activity, she walks for 45 minutes three times a week. Despite these efforts, she has gained two pounds over the last six weeks, a period that included Thanksgiving and Christmas.  She manages prediabetes by actively working on diet, exercise, and weight loss to improve glucose levels. She takes metformin  500 mg twice daily. Her most recent lab results from November 3rd showed a glucose level of 95, though it is unclear if this was a fasting measurement.  She is treated for vitamin D  deficiency with prescription ergocalciferol  50,000 IU weekly. Her vitamin D  level has improved from 26.4 earlier in the year to 70.  She experiences lower back pain, centered across her lower back. She has previously received epidural injections for this issue, which provided relief. The back pain interferes with her sleep. She has engaged in physical  therapy in the past and continues to use some of the exercises at home to manage her symptoms.      PHYSICAL EXAM:  Blood pressure 108/70, pulse 66, temperature 97.8 F (36.6 C), height 5' 6 (1.676 m), weight 243 lb (110.2 kg), SpO2 97%. Body mass index is 39.22 kg/m.  DIAGNOSTIC DATA REVIEWED:  BMET    Component Value Date/Time   NA 138 12/11/2023 1007   NA 139 12/11/2023 0943   K 3.6 12/11/2023 1007   CL 104 12/11/2023 1007   CO2 22 12/11/2023 1007   GLUCOSE 95 12/11/2023 1007   GLUCOSE 82 12/11/2023 0943   BUN 10 12/11/2023 1007   BUN 14 12/11/2023 0943   CREATININE 0.78 12/11/2023 1007   CALCIUM 9.1 12/11/2023 1007   GFRNONAA >60 12/11/2023 1007   GFRAA >60 09/08/2019 2320   Lab Results  Component Value Date   HGBA1C 5.6 12/11/2023   HGBA1C 5.9 (H) 08/01/2023   Lab Results  Component Value Date   INSULIN  10.3 12/11/2023   INSULIN  20.4 08/01/2023   Lab Results  Component Value Date   TSH 0.894 08/01/2023   CBC    Component Value Date/Time   WBC 8.9 12/11/2023 1007   RBC 4.97 12/11/2023 1007   HGB 13.6 12/11/2023 1007   HGB 13.8 08/01/2023 1015   HCT 41.4 12/11/2023 1007   HCT 45.3 08/01/2023 1015   PLT 296 12/11/2023 1007   PLT 289 08/01/2023 1015   MCV 83.3 12/11/2023 1007   MCV 87 08/01/2023 1015   MCH 27.4 12/11/2023 1007   MCHC 32.9 12/11/2023 1007  RDW 14.4 12/11/2023 1007   RDW 12.8 08/01/2023 1015   Iron Studies No results found for: IRON, TIBC, FERRITIN, IRONPCTSAT Lipid Panel     Component Value Date/Time   CHOL 233 (H) 12/11/2023 0943   TRIG 108 12/11/2023 0943   HDL 61 12/11/2023 0943   LDLCALC 153 (H) 12/11/2023 0943   Hepatic Function Panel     Component Value Date/Time   PROT 6.7 12/11/2023 1007   PROT 6.8 12/11/2023 0943   ALBUMIN 3.9 12/11/2023 1007   ALBUMIN 4.3 12/11/2023 0943   AST 19 12/11/2023 1007   ALT 16 12/11/2023 1007   ALKPHOS 59 12/11/2023 1007   BILITOT 1.0 12/11/2023 1007   BILITOT 0.9  12/11/2023 0943   BILIDIR 0.2 12/11/2023 1007   IBILI 0.8 12/11/2023 1007      Component Value Date/Time   TSH 0.894 08/01/2023 1015   Nutritional Lab Results  Component Value Date   VD25OH 39.0 12/11/2023   VD25OH 26.4 (L) 08/01/2023     Assessment and Plan Assessment & Plan Obesity Management is ongoing with a focus on diet, exercise, and weight loss. She has gained two pounds over the last six weeks, which is less than the average weight gain during the same period. She is following the category three eating plan 75% of the time, working on increasing protein intake, and consuming more fruits and vegetables. She is also hydrating adequately and not skipping meals. She walks for 45 minutes, three times a week. Challenges with meal planning and grocery costs are noted, but she is exploring affordable high-fiber, high-protein options like beans. - Continue category three eating plan - Encouraged meal planning and preparation - Continue walking 45 minutes, three times a week - Explore affordable high-fiber, high-protein foods like beans  Prediabetes Management is focused on diet, exercise, and weight loss to improve glucose levels. She is on metformin  500 mg twice a day. Recent glucose level was 95, though it is uncertain if this was a fasting level. - Continue metformin  500 mg twice a day - Continue diet and exercise regimen  Vitamin D  deficiency Managed with prescription ergocalciferol  50,000 IU weekly. Recent vitamin D  level was 39, improved from 26.4 earlier in the year but still below goal. - Refilled ergocalciferol  50,000 IU weekly  Chronic low back pain Primarily in the lower back, centered and not lateralized. Pain is severe enough to interfere with sleep. Previous epidural injections provided relief. She is considering repeating the procedure as recommended by her specialist. She also engages in home physical therapy exercises. - Scheduled epidural injection for low back  pain - Continue home physical therapy exercises      Patients who are on anti-obesity medications are counseled on the importance of maintaining healthy lifestyle habits, including balanced nutrition, regular physical activity, and behavioral modifications,  Medication is an adjunct to, not a replacement for, lifestyle changes and that the long-term success and weight maintenance depend on continued adherence to these strategies.   Arlenne was informed of the importance of frequent follow up visits to maximize her success with intensive lifestyle modifications for her obesity and obesity related health conditions as recommended by USPSTF and CMS guidelines  Louann Penton, MD   "

## 2024-03-05 ENCOUNTER — Encounter (INDEPENDENT_AMBULATORY_CARE_PROVIDER_SITE_OTHER): Payer: Self-pay | Admitting: Family Medicine

## 2024-03-05 ENCOUNTER — Ambulatory Visit (INDEPENDENT_AMBULATORY_CARE_PROVIDER_SITE_OTHER): Admitting: Family Medicine

## 2024-03-15 ENCOUNTER — Other Ambulatory Visit (INDEPENDENT_AMBULATORY_CARE_PROVIDER_SITE_OTHER): Payer: Self-pay | Admitting: Family Medicine

## 2024-03-15 DIAGNOSIS — E559 Vitamin D deficiency, unspecified: Secondary | ICD-10-CM

## 2024-04-03 ENCOUNTER — Ambulatory Visit (INDEPENDENT_AMBULATORY_CARE_PROVIDER_SITE_OTHER): Admitting: Family Medicine
